# Patient Record
Sex: Male | Born: 1975
Health system: Southern US, Community
[De-identification: ages and names within clinical notes are randomized; demographics above are authoritative.]

## PROBLEM LIST (undated history)

## (undated) DIAGNOSIS — I10 Essential (primary) hypertension: Secondary | ICD-10-CM

## (undated) DIAGNOSIS — R42 Dizziness and giddiness: Secondary | ICD-10-CM

## (undated) DIAGNOSIS — K219 Gastro-esophageal reflux disease without esophagitis: Secondary | ICD-10-CM

## (undated) DIAGNOSIS — K859 Acute pancreatitis without necrosis or infection, unspecified: Secondary | ICD-10-CM

## (undated) DIAGNOSIS — R55 Syncope and collapse: Secondary | ICD-10-CM

## (undated) DIAGNOSIS — E119 Type 2 diabetes mellitus without complications: Secondary | ICD-10-CM

## (undated) HISTORY — DX: Dizziness and giddiness: R42

## (undated) HISTORY — PX: HAND SURGERY: SHX662

## (undated) HISTORY — PX: FRACTURE SURGERY: SHX138

## (undated) HISTORY — DX: Syncope and collapse: R55

---

## 2006-09-14 ENCOUNTER — Emergency Department (HOSPITAL_COMMUNITY): Admission: EM | Admit: 2006-09-14 | Discharge: 2006-09-14 | Payer: Self-pay | Admitting: Emergency Medicine

## 2006-10-13 ENCOUNTER — Emergency Department (HOSPITAL_COMMUNITY): Admission: EM | Admit: 2006-10-13 | Discharge: 2006-10-13 | Payer: Self-pay | Admitting: Emergency Medicine

## 2006-10-14 ENCOUNTER — Inpatient Hospital Stay (HOSPITAL_COMMUNITY): Admission: EM | Admit: 2006-10-14 | Discharge: 2006-10-17 | Payer: Self-pay | Admitting: Emergency Medicine

## 2010-05-21 ENCOUNTER — Emergency Department (HOSPITAL_COMMUNITY): Admission: EM | Admit: 2010-05-21 | Discharge: 2009-10-26 | Payer: Self-pay | Admitting: Emergency Medicine

## 2010-08-31 LAB — DIFFERENTIAL
Basophils Absolute: 0 10*3/uL (ref 0.0–0.1)
Basophils Relative: 0 % (ref 0–1)
Eosinophils Absolute: 0.1 10*3/uL (ref 0.0–0.7)
Eosinophils Relative: 0 % (ref 0–5)
Lymphocytes Relative: 11 % — ABNORMAL LOW (ref 12–46)
Lymphs Abs: 1.8 10*3/uL (ref 0.7–4.0)
Monocytes Absolute: 0.8 10*3/uL (ref 0.1–1.0)
Monocytes Relative: 5 % (ref 3–12)
Neutro Abs: 12.8 10*3/uL — ABNORMAL HIGH (ref 1.7–7.7)
Neutrophils Relative %: 83 % — ABNORMAL HIGH (ref 43–77)

## 2010-08-31 LAB — CBC
HCT: 43.5 % (ref 39.0–52.0)
Hemoglobin: 14.9 g/dL (ref 13.0–17.0)
MCHC: 34.3 g/dL (ref 30.0–36.0)
MCV: 83.3 fL (ref 78.0–100.0)
Platelets: 173 10*3/uL (ref 150–400)
RBC: 5.22 MIL/uL (ref 4.22–5.81)
RDW: 13.6 % (ref 11.5–15.5)
WBC: 15.5 10*3/uL — ABNORMAL HIGH (ref 4.0–10.5)

## 2010-08-31 LAB — AMYLASE: Amylase: 132 U/L — ABNORMAL HIGH (ref 0–105)

## 2010-08-31 LAB — LIPASE, BLOOD: Lipase: 25 U/L (ref 11–59)

## 2010-10-30 NOTE — Discharge Summary (Signed)
Fred Boyd, Fred Boyd                 ACCOUNT NO.:  [id number]   MEDICAL RECORD NO.:  [id number]          PATIENT TYPE:  INP   LOCATION:  A325                          FACILITY:  [facility]   PHYSICIAN:  Marcello Moores, MD   DATE OF BIRTH:  02-01-1976   DATE OF ADMISSION:  10/14/2006  DATE OF DISCHARGE:  05/05/2008LH                               DISCHARGE SUMMARY   DISCHARGE DIAGNOSES:  1. Severe pharyngitis/tonsillitis, improving.  2. Slight elevation of transaminitis, resolving.   ADMISSION HOSPITAL COURSE:  Mr. Ballin is a 35 year old man with no  significant past medical history who presented with fever, sore throat  and difficulty swallowing.  On the examination, he showed enlarged and  inflamed tonsils with pharyngitis.  CAT scan of the neck was done at  that time which showed soft tissue fullness in the nasopharynx and  palatine tonsil region area.  Otherwise, there was no visible abscess.  ENT, Dr. Gerilyn Pilgrim, also consulted, and gave him her office number to have  follow-up with her after discharge.  The patient was put on clindamycin  and Rocephin IV and he improved progressively.   PHYSICAL EXAMINATION:  GENERAL:  The patient is stable and anxious to go  home.  VITAL SIGNS: Temperature 97.5, pulse is 59, respiratory rate is 18,  blood pressure 133/56 and saturation is 99%.  HEENT:  Pink conjunctivae.  The tonsil is swollen and hyperemic and  inflamed, but less swollen than on his admission and is able to swallow  solid food without any problem currently.  CHEST:  Good air entry and clear.  CVS:  S1-S2 regular, no murmur.  ABDOMEN:  Moderately no area of tenderness.  EXTREMITY:  No pedal edema.  CNS:  He is alert and oriented.   LABORATORY DATA:  CBC:  WBC 14, hemoglobin 13, hematocrit 39, platelet  230.  Chemistry:  Sodium is 141, potassium is 4.3, chloride 104 and  bicarb 30, glucose is 106, BUN 4, creatinine 1.  ALT is 63, AST is 43,  alkaline phosphatase is 74, protein is  5.8 and albumin is 2.5.   DISCHARGE/PLAN:  The patient is afebrile and able to swallow food  without any problem and is anxious to go home, asking repeatedly, and  was discharged with Augmentin 850 mg b.i.d. for another 7 days and to  have follow-up with ENT and repeat CBC and C-met in one week while he is  following with ENT.      Marcello Moores, MD  Electronically Signed    MT/MEDQ  D:  10/19/2006  T:  10/19/2006  Job:  [id number]

## 2010-10-30 NOTE — H&P (Signed)
NAME:  Fred Boyd, Fred Boyd NO.:  [id number]   MEDICAL RECORD NO.:  [id number]          PATIENT TYPE:  INP   LOCATION:  A325                          FACILITY:  [facility]   PHYSICIAN:  Catalina Pizza, M.D.        DATE OF BIRTH:  12-09-1975   DATE OF ADMISSION:  10/14/2006  DATE OF DISCHARGE:  LH                              HISTORY & PHYSICAL   The patient is unassigned and has no routine family doctor.   CHIEF COMPLAINT:  Severe sore throat, unable to swallow anything.   HISTORY OF PRESENT ILLNESS:  Fred Boyd is a 35 year old gentleman with  minimal past medical history, who apparently a month ago was seen for  bronchitis and given erythromycin at that time for a 10-day course.  Did  have improvement following this but the patient did note approximately 2  weeks ago that drinking beer, noticed some pain in his throat.  He  continued to have worsening pain in his throat over the next week and a  half and came into the emergency department yesterday for evaluation of  this.  Of note, at that time showed a white count of 16.6 with  significant left shift with an ANC of 14.8 and neutrophils of 89%.  He  also had elevation in his liver enzymes as well, AST and ALT.  He was  given IV antibiotics at that time and a CT scan was obtained of his  neck, which showed soft tissue fullness in the nasopharynx and palatine  tonsil region but did not show any significant occlusion.  It did show  prominent nodes throughout the neck, also likely due to the pharyngitis  and laryngitis.  Apparently the patient was treated with IV antibiotics  initially and then given a Z-Pak to take at home.  He was doing saline  gargle well as Chloraseptic spray and continued to worsen.   PAST MEDICAL HISTORY:  None.   PAST SURGICAL HISTORY:  None.   HOME MEDICATIONS:  None.   ALLERGIES:  No known drug allergies.   SOCIAL HISTORY:  Denies any drug use.  Has been current smoker within  the 12 months and  occasional drinker.  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   FAMILY HISTORY:  Noncontributory.   REVIEW OF SYSTEMS:  The patient states that he has fever and has not  noticed any weight loss.  Has not had any significant nausea and  vomiting or abdominal pain but, as mentioned above, significant  swallowing and throat pain.  Has had swelling in lymph nodes in his neck  as well.  Denies any problems with constipation, diarrhea, any problems  with urination.  No difficulty with chest pains or shortness of breath.  No other neurologic symptoms.  Skin-wise, the patient does note some  rash on his face as well as under his arms bilaterally.   PHYSICAL EXAMINATION:  VITAL SIGNS:  Vitals on admission showed  temperature of 101.4, trended up to 103.1, heart rate was initially 111,  decreased down to 81, blood pressure is 143/58 at this time, 97% on room  air.  GENERAL:  This is a well-nourished, well-developed African American  gentleman lying in bed in no acute distress but still sick-appearing.  HEENT:  Pupils equal, round and react to light and accommodation.  TMs  are normal bilaterally.  Does have some mild erythematous nodular  plaques on his cheeks bilaterally.  Oropharynx shows significant bad  dentition but very enlarged, erythematous tonsils with a significant  white erosive exudate throughout posterior oropharynx.  Did have whitish  patches also on back of his tongue.  Some question of mild boggy sclerae  or mild jaundice.  NECK:  Neck is supple but does have very painful lymph nodes on  palpation in both anterior and posterior chains with palpable lymph  nodes approximately 1 cm in diameter.  LUNGS:  Clear to auscultation bilaterally.  HEART:  Regular rate and rhythm.  No murmurs, gallops or rubs.  ABDOMEN:  Soft, nontender, positive bowel sounds.  No hepatosplenomegaly  appreciated.  No tenderness or guarding.   EXTREMITIES:  No lower extremity edema.  2+ pulses in all extremities.  SKIN:  The patient does have erythematous nodular areas under both arms,  which were painful to touch.  NEUROLOGIC:  The patient is alert and oriented x3.  No deficits  appreciated.  Moving all extremities.   Lab work obtained is still all pending.  Has had rapid strep done on two  consecutive days, which have been negative.  Please note lab work from  yesterday.   IMPRESSION:  This is a 35 year old gentleman with significant  tonsillitis and pharyngitis with erosive ulcerations with febrile  illness as well mild elevation his liver enzymes.   ASSESSMENT/PLAN:  1. Tonsillitis, pharyngitis.  Will give the patient IV clindamycin and      ceftriaxone to cover broad spectrum causes for this.  Will also do      a gamut of tests looking for causes including monospot as well as a      throat culture for GC and chlamydia.  Also do two blood cultures as      well given his significant fever.  Will treat with Tylenol for his      fever.  2. Nodular skin reaction under his arms.  It is almost like a      hidradenitis suppurativa given the firm nodularity, but question      now if this is related to all his other issues going on, unclear.  3. Transaminitis.  Did have slight bump in his AST and ALT, and will      check an acute hepatitis panel at this time.   DISPOSITION:  The patient will be admitted to the floor with symptomatic  treatment with Duke's mixture and viscous lidocaine as well as morphine  and Tylenol.  Given the severity of how his posterior oropharynx looks,  I will consult ENT to see if they have anything else to offer up a  possibility that could be causing this severe pharyngitis.  I did ask  the patient and he did have a negative HIV test done approximately 2  months ago.  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      Catalina Pizza, M.D.   Electronically Signed     ZH/MEDQ  D:  10/14/2006  T:  10/14/2006  Job:  [id number]

## 2012-03-08 ENCOUNTER — Emergency Department (HOSPITAL_COMMUNITY)
Admission: EM | Admit: 2012-03-08 | Discharge: 2012-03-08 | Disposition: A | Discharge status: Home / Self Care | Payer: Self-pay | Attending: Emergency Medicine | Admitting: Emergency Medicine

## 2012-03-08 ENCOUNTER — Encounter (HOSPITAL_COMMUNITY): Payer: Self-pay | Admitting: *Deleted

## 2012-03-08 DIAGNOSIS — K089 Disorder of teeth and supporting structures, unspecified: Secondary | ICD-10-CM | POA: Insufficient documentation

## 2012-03-08 DIAGNOSIS — K0889 Other specified disorders of teeth and supporting structures: Secondary | ICD-10-CM

## 2012-03-08 DIAGNOSIS — Z880 Allergy status to penicillin: Secondary | ICD-10-CM | POA: Insufficient documentation

## 2012-03-08 MED ORDER — OXYCODONE-ACETAMINOPHEN 5-325 MG PO TABS: ORAL_TABLET | ORAL | Status: DC

## 2012-03-08 MED ORDER — CLINDAMYCIN HCL 150 MG PO CAPS: 300.0000 mg | ORAL_CAPSULE | Freq: Three times a day (TID) | ORAL | Status: DC

## 2012-03-08 NOTE — ED Notes (Signed)
C/o right lower toothache x 2 months, off and on.

## 2012-03-08 NOTE — ED Provider Notes (Signed)
History     CSN: [id number]  Arrival date & time 03/08/12  1256   First MD Initiated Contact with Patient 03/08/12 1425      Chief Complaint  Patient presents with  . Dental Pain    (Consider location/radiation/quality/duration/timing/severity/associated sxs/prior treatment) Patient is a 36 y.o. male presenting with tooth pain. The history is provided by the patient. No language interpreter was used.  Dental PainThe primary symptoms include mouth pain. Primary symptoms do not include dental injury or fever. Episode onset: several weeks ago. The symptoms are worsening. The symptoms occur constantly.  Additional symptoms include: jaw pain. Additional symptoms do not include: purulent gums, trismus and facial swelling.    History reviewed. No pertinent past medical history.  History reviewed. No pertinent past surgical history.  No family history on file.  History  Substance Use Topics  . Smoking status: Not on file  . Smokeless tobacco: Not on file  . Alcohol Use: Not on file      Review of Systems  Constitutional: Negative for fever and chills.  HENT: Positive for dental problem. Negative for facial swelling.   All other systems reviewed and are negative.    Allergies  Penicillins  Home Medications   Current Outpatient Rx  Name Route Sig Dispense Refill  . CLINDAMYCIN HCL 150 MG PO CAPS Oral Take 2 capsules (300 mg total) by mouth 3 (three) times daily. 60 capsule 0  . OXYCODONE-ACETAMINOPHEN 5-325 MG PO TABS  One tab po q 4-6 hrs prn pain 15 tablet 0    BP 145/89  Pulse 84  Temp 98.1 F (36.7 C)  Ht 6\' 1"  (1.854 m)  Wt 187 lb (84.823 kg)  BMI 24.67 kg/m2  SpO2 100%  Physical Exam  Nursing note and vitals reviewed. Constitutional: He is oriented to person, place, and time. He appears well-developed and well-nourished.  HENT:  Head: Normocephalic and atraumatic.  Mouth/Throat: Uvula is midline, oropharynx is clear and moist and mucous membranes are  normal. Normal dentition. Dental caries present. No dental abscesses or uvula swelling.    Eyes: EOM are normal.  Neck: Normal range of motion.  Cardiovascular: Normal rate, regular rhythm, normal heart sounds and intact distal pulses.   Pulmonary/Chest: Effort normal and breath sounds normal. No respiratory distress.  Abdominal: Soft. He exhibits no distension. There is no tenderness.  Musculoskeletal: Normal range of motion.  Neurological: He is alert and oriented to person, place, and time.  Skin: Skin is warm and dry.  Psychiatric: He has a normal mood and affect. Judgment normal.    ED Course  Procedures (including critical care time)  Labs Reviewed - No data to display No results found.   1. Pain, dental       MDM  no obvious abscess  rx-clindamycin rx-percocet Ibuprofen F/u with dentist ASAP         Evalina Field, PA 03/08/12 1440

## 2012-03-08 NOTE — ED Provider Notes (Signed)
Medical screening examination/treatment/procedure(s) were performed by non-physician practitioner and as supervising physician I was immediately available for consultation/collaboration.   Evaluna Utke III, MD 03/08/12 2029 

## 2012-05-15 ENCOUNTER — Emergency Department (HOSPITAL_COMMUNITY)
Admission: EM | Admit: 2012-05-15 | Discharge: 2012-05-15 | Disposition: A | Discharge status: Home / Self Care | Payer: 59 | Attending: Emergency Medicine | Admitting: Emergency Medicine

## 2012-05-15 ENCOUNTER — Encounter (HOSPITAL_COMMUNITY): Payer: Self-pay | Admitting: Emergency Medicine

## 2012-05-15 DIAGNOSIS — R1110 Vomiting, unspecified: Secondary | ICD-10-CM | POA: Insufficient documentation

## 2012-05-15 DIAGNOSIS — Z9889 Other specified postprocedural states: Secondary | ICD-10-CM | POA: Insufficient documentation

## 2012-05-15 DIAGNOSIS — Z79899 Other long term (current) drug therapy: Secondary | ICD-10-CM | POA: Insufficient documentation

## 2012-05-15 DIAGNOSIS — F17200 Nicotine dependence, unspecified, uncomplicated: Secondary | ICD-10-CM | POA: Insufficient documentation

## 2012-05-15 DIAGNOSIS — K209 Esophagitis, unspecified without bleeding: Secondary | ICD-10-CM | POA: Insufficient documentation

## 2012-05-15 DIAGNOSIS — R1310 Dysphagia, unspecified: Secondary | ICD-10-CM | POA: Insufficient documentation

## 2012-05-15 DIAGNOSIS — R12 Heartburn: Secondary | ICD-10-CM | POA: Insufficient documentation

## 2012-05-15 MED ORDER — GI COCKTAIL ~~LOC~~
30.0000 mL | Freq: Once | ORAL | Status: AC
  Administered 2012-05-15: 30 mL via ORAL
  Filled 2012-05-15: qty 30

## 2012-05-15 NOTE — ED Provider Notes (Signed)
History     CSN: [id number]  Arrival date & time 05/15/12  4098   First MD Initiated Contact with Patient 05/15/12 0813      Chief Complaint  Patient presents with  . Heartburn    (Consider location/radiation/quality/duration/timing/severity/associated sxs/prior treatment) HPI Comments: Patient c/o localized pain to the center of his chest that began after eating fried chicken yesterday.  States that it "felt like a knot in my throat that I could not swallow".  He states he tried to drink water and took Tums without relief.  Also drank soda last night but states that he vomited it back up.  Has not tried to eat or drink anything this morning.  States that he was prescribed Prilosec in the past, but he stopped taking it.  He denies hx of similar sx's, persistent vomiting, abdominal pain, shortness or breath.  States he can drink water and swallow his saliva   Patient is a 36 y.o. male presenting with heartburn. The history is provided by the patient.  Heartburn This is a new problem. The current episode started yesterday. The problem occurs constantly. The problem has been unchanged. Associated symptoms include chest pain and vomiting. Pertinent negatives include no abdominal pain, change in bowel habit, chills, congestion, coughing, diaphoresis, fever, headaches, joint swelling, nausea, neck pain, numbness, rash, sore throat or swollen glands. The symptoms are aggravated by eating. He has tried drinking Sport and exercise psychologist) for the symptoms. The treatment provided no relief.    History reviewed. No pertinent past medical history.  Past Surgical History  Procedure Date  . Hand surgery     No family history on file.  History  Substance Use Topics  . Smoking status: Light Tobacco Smoker  . Smokeless tobacco: Not on file  . Alcohol Use: Yes      Review of Systems  Constitutional: Negative for fever, chills, diaphoresis and appetite change.  HENT: Positive for trouble swallowing. Negative for  congestion, sore throat, neck pain and neck stiffness.   Respiratory: Negative for cough, choking, chest tightness and wheezing.   Cardiovascular: Positive for chest pain. Negative for palpitations.  Gastrointestinal: Positive for heartburn and vomiting. Negative for nausea, abdominal pain, diarrhea and change in bowel habit.  Musculoskeletal: Negative for joint swelling.  Skin: Negative for rash.  Neurological: Negative for numbness and headaches.  All other systems reviewed and are negative.    Allergies  Penicillins  Home Medications   Current Outpatient Rx  Name  Route  Sig  Dispense  Refill  . CALCIUM CARBONATE ANTACID 500 MG PO CHEW   Oral   Chew 2 tablets by mouth 3 (three) times daily as needed. Heartburn.         Marland Kitchen CLEAR EYES COMPLETE OP   Ophthalmic   Apply 2 drops to eye daily.           BP 149/76  Pulse 71  Temp 98.1 F (36.7 C)  Resp 20  Ht 6\' 1"  (1.854 m)  Wt 187 lb (84.823 kg)  BMI 24.67 kg/m2  SpO2 100%  Physical Exam  Nursing note and vitals reviewed. Constitutional: He is oriented to person, place, and time. He appears well-developed and well-nourished. No distress.  HENT:  Head: Normocephalic and atraumatic.  Mouth/Throat: Uvula is midline, oropharynx is clear and moist and mucous membranes are normal. No uvula swelling.  Neck: Normal range of motion. Neck supple. No tracheal deviation present.  Cardiovascular: Normal rate, normal heart sounds and intact distal pulses.   No murmur  heard. Pulmonary/Chest: Effort normal and breath sounds normal. No stridor. No respiratory distress. He exhibits no tenderness.  Abdominal: Soft. He exhibits no distension and no mass. There is no tenderness. There is no rebound and no guarding.  Musculoskeletal: Normal range of motion.  Lymphadenopathy:    He has no cervical adenopathy.  Neurological: He is alert and oriented to person, place, and time. He exhibits normal muscle tone. Coordination normal.  Skin:  Skin is warm and dry.    ED Course  Procedures (including critical care time)  Labs Reviewed - No data to display No results found.      MDM    9:30 AM patient is feeling better, sx's have resolved.  Drank fluids and ate crackers here in ED w/o difficulty or vomiting.  Sx's were likely related to esophageal irritation secondary to food   Pt agrees to soft foods today and f/u with his PMD or return here if needed     Srihan Brutus L. [place], [place] 05/15/12 [phone]

## 2012-05-15 NOTE — ED Notes (Signed)
Tolerated crackers w/o n/v.

## 2012-05-15 NOTE — ED Notes (Signed)
Pt c/o heartburn since yesterday evening. No relief from TUMS.

## 2012-05-15 NOTE — ED Notes (Signed)
Instructions and f/u information provided/reviewed; verbalizes understanding.  

## 2012-05-15 NOTE — ED Notes (Signed)
Pt tolerating po water; states, "I feel a whole lot better".  Given saltines crackers for po challenge.

## 2012-05-16 NOTE — ED Provider Notes (Signed)
Medical screening examination/treatment/procedure(s) were performed by non-physician practitioner and as supervising physician I was immediately available for consultation/collaboration.  Shelda Jakes, MD 05/16/12 [phone]

## 2012-07-06 ENCOUNTER — Encounter (HOSPITAL_COMMUNITY): Payer: Self-pay | Admitting: Emergency Medicine

## 2012-07-06 ENCOUNTER — Emergency Department (HOSPITAL_COMMUNITY)
Admission: EM | Admit: 2012-07-06 | Discharge: 2012-07-06 | Disposition: A | Discharge status: Home / Self Care | Payer: 59 | Attending: Emergency Medicine | Admitting: Emergency Medicine

## 2012-07-06 DIAGNOSIS — Z87891 Personal history of nicotine dependence: Secondary | ICD-10-CM | POA: Insufficient documentation

## 2012-07-06 DIAGNOSIS — K2990 Gastroduodenitis, unspecified, without bleeding: Secondary | ICD-10-CM | POA: Insufficient documentation

## 2012-07-06 DIAGNOSIS — K2970 Gastritis, unspecified, without bleeding: Secondary | ICD-10-CM | POA: Insufficient documentation

## 2012-07-06 DIAGNOSIS — K920 Hematemesis: Secondary | ICD-10-CM | POA: Insufficient documentation

## 2012-07-06 LAB — LIPASE, BLOOD: Lipase: 29 U/L (ref 11–59)

## 2012-07-06 LAB — COMPREHENSIVE METABOLIC PANEL
ALT: 87 U/L — ABNORMAL HIGH (ref 0–53)
AST: 40 U/L — ABNORMAL HIGH (ref 0–37)
Albumin: 4.5 g/dL (ref 3.5–5.2)
Alkaline Phosphatase: 68 U/L (ref 39–117)
BUN: 9 mg/dL (ref 6–23)
CO2: 29 mEq/L (ref 19–32)
Calcium: 10 mg/dL (ref 8.4–10.5)
Chloride: 102 mEq/L (ref 96–112)
Creatinine, Ser: 1.13 mg/dL (ref 0.50–1.35)
GFR calc Af Amer: >90 mL/min (ref >90)
GFR calc non Af Amer: 82 mL/min — ABNORMAL LOW (ref >90)
Glucose, Bld: 97 mg/dL (ref 70–99)
Potassium: 4.2 mEq/L (ref 3.5–5.1)
Sodium: 140 mEq/L (ref 135–145)
Total Bilirubin: 0.9 mg/dL (ref 0.3–1.2)
Total Protein: 8.6 g/dL — ABNORMAL HIGH (ref 6.0–8.3)

## 2012-07-06 LAB — CBC WITH DIFFERENTIAL/PLATELET
Basophils Absolute: 0 10*3/uL (ref 0.0–0.1)
Basophils Relative: 0 % (ref 0–1)
Eosinophils Absolute: 0 10*3/uL (ref 0.0–0.7)
Eosinophils Relative: 0 % (ref 0–5)
HCT: 46 % (ref 39.0–52.0)
Hemoglobin: 15.2 g/dL (ref 13.0–17.0)
Lymphocytes Relative: 17 % (ref 12–46)
Lymphs Abs: 1.6 10*3/uL (ref 0.7–4.0)
MCH: 27.4 pg (ref 26.0–34.0)
MCHC: 33 g/dL (ref 30.0–36.0)
MCV: 82.9 fL (ref 78.0–100.0)
Monocytes Absolute: 0.8 10*3/uL (ref 0.1–1.0)
Monocytes Relative: 8 % (ref 3–12)
Neutro Abs: 7.1 10*3/uL (ref 1.7–7.7)
Neutrophils Relative %: 75 % (ref 43–77)
Platelets: 188 10*3/uL (ref 150–400)
RBC: 5.55 MIL/uL (ref 4.22–5.81)
RDW: 12.7 % (ref 11.5–15.5)
WBC: 9.5 10*3/uL (ref 4.0–10.5)

## 2012-07-06 MED ORDER — PANTOPRAZOLE SODIUM 40 MG IV SOLR
40.0000 mg | Freq: Once | INTRAVENOUS | Status: AC
  Administered 2012-07-06: 40 mg via INTRAVENOUS
  Filled 2012-07-06: qty 40

## 2012-07-06 MED ORDER — HYDROCODONE-ACETAMINOPHEN 5-325 MG PO TABS: 1.0000 | ORAL_TABLET | Freq: Four times a day (QID) | ORAL | Status: DC | PRN

## 2012-07-06 MED ORDER — ONDANSETRON 4 MG PO TBDP: ORAL_TABLET | ORAL | Status: DC

## 2012-07-06 MED ORDER — KETOROLAC TROMETHAMINE 30 MG/ML IJ SOLN
30.0000 mg | Freq: Once | INTRAMUSCULAR | Status: AC
Start: 2012-07-06 — End: 2012-07-06
  Administered 2012-07-06: 30 mg via INTRAVENOUS
  Filled 2012-07-06: qty 1

## 2012-07-06 MED ORDER — PANTOPRAZOLE SODIUM 20 MG PO TBEC: DELAYED_RELEASE_TABLET | ORAL | Status: DC

## 2012-07-06 MED ORDER — ONDANSETRON HCL 4 MG/2ML IJ SOLN
4.0000 mg | Freq: Once | INTRAMUSCULAR | Status: AC
  Administered 2012-07-06: 4 mg via INTRAVENOUS
  Filled 2012-07-06: qty 2

## 2012-07-06 MED ORDER — SODIUM CHLORIDE 0.9 % IV BOLUS (SEPSIS)
1000.0000 mL | Freq: Once | INTRAVENOUS | Status: AC
  Administered 2012-07-06: 1000 mL via INTRAVENOUS

## 2012-07-06 NOTE — ED Notes (Signed)
Pt c/o sudden onset n/v x 5 hours. C/o lower abd pain. Denies constipation/d. nad noted.

## 2012-07-06 NOTE — ED Provider Notes (Signed)
History     CSN: [id number]  Arrival date & time 07/06/12  1301   First MD Initiated Contact with Patient 07/06/12 1404      Chief Complaint  Patient presents with  . Emesis    (Consider location/radiation/quality/duration/timing/severity/associated sxs/prior treatment) Patient is a 37 y.o. male presenting with vomiting. The history is provided by the patient (pt complains of much vomiting today.  pt has vomited up some blood). No language interpreter was used.  Emesis  This is a new problem. The current episode started yesterday. The problem occurs more than 10 times per day. The problem has not changed since onset.The emesis has an appearance of stomach contents and bright red blood. There has been no fever. Pertinent negatives include no abdominal pain, no chills, no cough, no diarrhea and no headaches. Risk factors: drinks alcohol.    History reviewed. No pertinent past medical history.  Past Surgical History  Procedure Date  . Hand surgery     History reviewed. No pertinent family history.  History  Substance Use Topics  . Smoking status: Former Games developer  . Smokeless tobacco: Not on file  . Alcohol Use: Yes     Comment: occasional      Review of Systems  Constitutional: Negative for chills and fatigue.  HENT: Negative for congestion, sinus pressure and ear discharge.   Eyes: Negative for discharge.  Respiratory: Negative for cough.   Cardiovascular: Negative for chest pain.  Gastrointestinal: Positive for vomiting. Negative for abdominal pain and diarrhea.  Genitourinary: Negative for frequency and hematuria.  Musculoskeletal: Negative for back pain.  Skin: Negative for rash.  Neurological: Negative for seizures and headaches.  Hematological: Negative.   Psychiatric/Behavioral: Negative for hallucinations.    Allergies  Penicillins  Home Medications   Current Outpatient Rx  Name  Route  Sig  Dispense  Refill  . CLINDAMYCIN HCL 150 MG PO CAPS   Oral  Take 300 mg by mouth 3 (three) times daily.         Marland Kitchen CLEAR EYES COMPLETE OP   Ophthalmic   Apply 2 drops to eye daily.         . ADULT MULTIVITAMIN W/MINERALS CH   Oral   Take 1 tablet by mouth daily.         Marland Kitchen ONDANSETRON 4 MG PO TBDP      4mg  ODT q4 hours prn nausea/vomit   12 tablet   0   . PANTOPRAZOLE SODIUM 20 MG PO TBEC      One tablet a day   30 tablet   0     BP 138/90  Pulse 65  Temp 98.1 F (36.7 C)  Resp 18  Ht 6\' 1"  (1.854 m)  Wt 190 lb (86.183 kg)  BMI 25.07 kg/m2  SpO2 100%  Physical Exam  Constitutional: He is oriented to person, place, and time. He appears well-developed.  HENT:  Head: Normocephalic and atraumatic.  Eyes: Conjunctivae normal and EOM are normal. No scleral icterus.  Neck: Neck supple. No thyromegaly present.  Cardiovascular: Normal rate and regular rhythm.  Exam reveals no gallop and no friction rub.   No murmur heard. Pulmonary/Chest: No stridor. He has no wheezes. He has no rales. He exhibits no tenderness.  Abdominal: He exhibits no distension. There is tenderness. There is no rebound.  Musculoskeletal: Normal range of motion. He exhibits no edema.  Lymphadenopathy:    He has no cervical adenopathy.  Neurological: He is oriented to person, place, and time.  Coordination normal.  Skin: No rash noted. No erythema.  Psychiatric: He has a normal mood and affect. His behavior is normal.    ED Course  Procedures (including critical care time)  Labs Reviewed  COMPREHENSIVE METABOLIC PANEL - Abnormal; Notable for the following:    Total Protein 8.6 (*)     AST 40 (*)     ALT 87 (*)     GFR calc non Af Amer 82 (*)     All other components within normal limits  CBC WITH DIFFERENTIAL  LIPASE, BLOOD   No results found.   1. Gastritis       MDM          Benny Lennert, MD 07/06/12 1620

## 2012-07-31 ENCOUNTER — Ambulatory Visit: Payer: 59 | Admitting: Gastroenterology

## 2012-07-31 ENCOUNTER — Telehealth: Payer: Self-pay | Admitting: Gastroenterology

## 2012-07-31 NOTE — Telephone Encounter (Signed)
Pt was a no show

## 2015-10-07 ENCOUNTER — Ambulatory Visit: Payer: Self-pay | Admitting: Allergy and Immunology

## 2016-05-02 ENCOUNTER — Emergency Department (HOSPITAL_COMMUNITY)
Admission: EM | Admit: 2016-05-02 | Discharge: 2016-05-02 | Disposition: A | Discharge status: Home / Self Care | Payer: No Typology Code available for payment source | Attending: Emergency Medicine | Admitting: Emergency Medicine

## 2016-05-02 ENCOUNTER — Encounter (HOSPITAL_COMMUNITY): Payer: Self-pay | Admitting: Emergency Medicine

## 2016-05-02 ENCOUNTER — Emergency Department (HOSPITAL_COMMUNITY): Payer: No Typology Code available for payment source

## 2016-05-02 DIAGNOSIS — W1809XA Striking against other object with subsequent fall, initial encounter: Secondary | ICD-10-CM | POA: Insufficient documentation

## 2016-05-02 DIAGNOSIS — Y9389 Activity, other specified: Secondary | ICD-10-CM | POA: Diagnosis not present

## 2016-05-02 DIAGNOSIS — Y929 Unspecified place or not applicable: Secondary | ICD-10-CM | POA: Insufficient documentation

## 2016-05-02 DIAGNOSIS — E119 Type 2 diabetes mellitus without complications: Secondary | ICD-10-CM | POA: Diagnosis not present

## 2016-05-02 DIAGNOSIS — S0232XA Fracture of orbital floor, left side, initial encounter for closed fracture: Secondary | ICD-10-CM | POA: Insufficient documentation

## 2016-05-02 DIAGNOSIS — F17200 Nicotine dependence, unspecified, uncomplicated: Secondary | ICD-10-CM | POA: Insufficient documentation

## 2016-05-02 DIAGNOSIS — Z79899 Other long term (current) drug therapy: Secondary | ICD-10-CM | POA: Insufficient documentation

## 2016-05-02 DIAGNOSIS — Y999 Unspecified external cause status: Secondary | ICD-10-CM | POA: Insufficient documentation

## 2016-05-02 DIAGNOSIS — I10 Essential (primary) hypertension: Secondary | ICD-10-CM | POA: Diagnosis not present

## 2016-05-02 DIAGNOSIS — S0990XA Unspecified injury of head, initial encounter: Secondary | ICD-10-CM | POA: Diagnosis present

## 2016-05-02 HISTORY — DX: Type 2 diabetes mellitus without complications: E11.9

## 2016-05-02 HISTORY — DX: Essential (primary) hypertension: I10

## 2016-05-02 LAB — CBG MONITORING, ED: Glucose-Capillary: 101 mg/dL — ABNORMAL HIGH (ref 65–99)

## 2016-05-02 MED ORDER — CLINDAMYCIN HCL 150 MG PO CAPS: 150.0000 mg | ORAL_CAPSULE | Freq: Four times a day (QID) | ORAL | 0 refills | Status: DC

## 2016-05-02 MED ORDER — OXYCODONE-ACETAMINOPHEN 5-325 MG PO TABS: 1.0000 | ORAL_TABLET | ORAL | 0 refills | Status: DC | PRN

## 2016-05-02 MED ORDER — HYDROMORPHONE HCL 1 MG/ML IJ SOLN
1.0000 mg | Freq: Once | INTRAMUSCULAR | Status: AC
  Administered 2016-05-02: 1 mg via INTRAMUSCULAR
  Filled 2016-05-02: qty 1

## 2016-05-02 NOTE — ED Notes (Signed)
Pt verbalized understanding of no driving and to use caution within 4 hours of taking pain meds due to meds cause drowsiness 

## 2016-05-02 NOTE — Discharge Instructions (Signed)
You'll need to follow-up with an ear nose and throat specialist Dr. Suszanne Connerseoh.  [facility] [place] [facility] [place] [facility] [place]. Call tomorrow for a follow-up appointment. Ice pack to face. Prescription for pain medicine and antibiotic.

## 2016-05-02 NOTE — ED Notes (Signed)
Patient transported to CT 

## 2016-05-02 NOTE — ED Notes (Signed)
Pt states that at times he has woke up dizziness in the morning.  Today he fell and hit left side of face on bed post.  cbg 101 currently.  C/o severe pain to left eye, left eye red.

## 2016-05-02 NOTE — ED Triage Notes (Signed)
Pt states when he woke this am he had "extremely bad dizziness. Pt fell and hit the bed rail injuring the L side of his face and his L eye. Pt reports blurry vision in his L eye. Eye is reddened and there is L sided facial edema

## 2016-05-03 ENCOUNTER — Ambulatory Visit (INDEPENDENT_AMBULATORY_CARE_PROVIDER_SITE_OTHER): Payer: No Typology Code available for payment source | Admitting: Otolaryngology

## 2016-05-03 DIAGNOSIS — S0232XA Fracture of orbital floor, left side, initial encounter for closed fracture: Secondary | ICD-10-CM | POA: Diagnosis not present

## 2016-05-03 NOTE — ED Provider Notes (Signed)
[facility] DEPT Provider Note   CSN: [id number] Arrival date & time: 05/02/16  1331     History   Chief Complaint Chief Complaint  Patient presents with  . Dizziness  . Facial Injury    HPI Fred Boyd is a 40 y.o. male.  Status post accidental fall this morning as he got out of bed striking the left side of his face on the bedpost. Fall was preceded by dizziness. No residual neurological deficits. Now complains of severe pain in his left cheek and redness in his left eye. No neck injury or extremity pain. Severity of pain is moderate      Past Medical History:  Diagnosis Date  . Diabetes mellitus without complication (HCC)   . Hypertension     There are no active problems to display for this patient.   Past Surgical History:  Procedure Laterality Date  . FRACTURE SURGERY    . HAND SURGERY         Home Medications    Prior to Admission medications   Medication Sig Start Date End Date Taking? Authorizing Provider  escitalopram (LEXAPRO) 10 MG tablet Take 10 mg by mouth daily.   Yes Historical Provider, MD  clindamycin (CLEOCIN) 150 MG capsule Take 1 capsule (150 mg total) by mouth every 6 (six) hours. 05/02/16   Donnetta HutchingBrian Gillis Boardley, MD  oxyCODONE-acetaminophen (PERCOCET) 5-325 MG tablet Take 1-2 tablets by mouth every 4 (four) hours as needed. 05/02/16   Donnetta HutchingBrian Jonelle Bann, MD    Family History Family History  Problem Relation Age of Onset  . Diabetes Mother   . Hypertension Mother   . Diabetes Father   . Hypertension Father     Social History Social History  Substance Use Topics  . Smoking status: Current Some Day Smoker  . Smokeless tobacco: Never Used  . Alcohol use Yes     Comment: occasional     Allergies   Penicillins   Review of Systems Review of Systems  All other systems reviewed and are negative.    Physical Exam Updated Vital Signs BP 130/85 (BP Location: Left Arm)   Pulse 88   Temp 97.9 F (36.6 C) (Oral)   Resp 20   Ht 6'  (1.829 m)   Wt 185 lb (83.9 kg)   SpO2 100%   BMI 25.09 kg/m   Physical Exam  Constitutional: He is oriented to person, place, and time. He appears well-developed and well-nourished.  HENT:  Head: Normocephalic.  Tender over maxillary sinus and inferior orbit  Eyes:  Hyphema noted on lateral aspect of left eye.  Extraocular movements intact  Neck: Neck supple.  Cardiovascular: Normal rate and regular rhythm.   Pulmonary/Chest: Effort normal and breath sounds normal.  Abdominal: Soft. Bowel sounds are normal.  Musculoskeletal: Normal range of motion.  Neurological: He is alert and oriented to person, place, and time.  Skin: Skin is warm and dry.  Psychiatric: He has a normal mood and affect. His behavior is normal.  Nursing note and vitals reviewed.    ED Treatments / Results  Labs (all labs ordered are listed, but only abnormal results are displayed) Labs Reviewed  CBG MONITORING, ED - Abnormal; Notable for the following:       Result Value   Glucose-Capillary 101 (*)    All other components within normal limits    EKG  EKG Interpretation  Date/Time:  Sunday May 02 2016 13:44:45 EST Ventricular Rate:  95 PR Interval:  120 QRS Duration: 80  QT Interval:  346 QTC Calculation: 434 R Axis:   78 Text Interpretation:  Normal sinus rhythm Left ventricular hypertrophy Abnormal ekg Confirmed by Gerhard MunchLOCKWOOD, ROBERT  MD [phone]([id number]) on 05/02/2016 1:56:13 PM       Radiology Ct Head Wo Contrast  Result Date: 05/02/2016 CLINICAL DATA:  Pain following fall.  Blurred vision left eye EXAM: CT HEAD WITHOUT CONTRAST CT MAXILLOFACIAL WITHOUT CONTRAST TECHNIQUE: Multidetector CT imaging of the head and maxillofacial structures were performed using the standard protocol without intravenous contrast. Multiplanar CT image reconstructions of the maxillofacial structures were also generated. COMPARISON:  None FINDINGS: CT HEAD FINDINGS Brain: The ventricles are normal in size and  configuration. There is no intracranial mass, hemorrhage, extra-axial fluid collection, or midline shift. Gray-white compartments appear normal. No acute infarct evident. Vascular: No hyperdense vessel. There is slight calcification in each carotid siphon region. Skull: The bony calvarium appears intact. Other: Mastoid air cells are clear. CT MAXILLOFACIAL FINDINGS Osseous: There is a fracture of the orbital floor on the left with depression of fat inferiorly into the superior aspect of the left maxillary antrum. The inferior orbital oblique muscle on the left is slightly displaced inferiorly but does not appear entrapped. No other fracture is evident. No dislocation is evident. No evident blastic or lytic bone lesions. Orbits: As stated above, the left inferior oblique muscle appears slightly inferiorly displaced but non entrapped. There is no intraorbital lesion. Globes appear symmetric bilaterally. There is no enlargement of the extra-ocular muscles, in the fat in the Conal regions appear symmetric and unremarkable bilaterally. Sinuses: There is mild mucosal thickening in each medial frontal sinus region. There is mild mucosal thickening in several ethmoid air cells bilaterally. There is a small retention cyst in the inferomedial right maxillary antrum. There are no air-fluid levels. No bony destruction or expansion. Soft tissues: Salivary glands appear symmetric bilaterally. No adenopathy. Visualized pharynx and upper cervical spine regions appear normal. No soft tissue abscess. IMPRESSION: CT head: No intracranial mass, hemorrhage, or extra-axial fluid collection. Gray-white compartments appear normal. There is slight carotid siphon vascular calcification bilaterally. CT maxillofacial: Depressed fracture of the left orbital floor with fat extending inferiorly into the superior left maxillary antrum. The inferior oblique muscle on the left is mildly displaced inferiorly but not entrapped. No other fracture  evident. No dislocation. Orbits appear unremarkable except for the changes in the left orbital floor region. There are areas of mild paranasal sinus disease. No bony destruction or expansion. Electronically Signed   By: Bretta BangWilliam  Woodruff III M.D.   On: 05/02/2016 15:02   Ct Maxillofacial Wo Cm  Result Date: 05/02/2016 CLINICAL DATA:  Pain following fall.  Blurred vision left eye EXAM: CT HEAD WITHOUT CONTRAST CT MAXILLOFACIAL WITHOUT CONTRAST TECHNIQUE: Multidetector CT imaging of the head and maxillofacial structures were performed using the standard protocol without intravenous contrast. Multiplanar CT image reconstructions of the maxillofacial structures were also generated. COMPARISON:  None FINDINGS: CT HEAD FINDINGS Brain: The ventricles are normal in size and configuration. There is no intracranial mass, hemorrhage, extra-axial fluid collection, or midline shift. Gray-white compartments appear normal. No acute infarct evident. Vascular: No hyperdense vessel. There is slight calcification in each carotid siphon region. Skull: The bony calvarium appears intact. Other: Mastoid air cells are clear. CT MAXILLOFACIAL FINDINGS Osseous: There is a fracture of the orbital floor on the left with depression of fat inferiorly into the superior aspect of the left maxillary antrum. The inferior orbital oblique muscle on the left is slightly displaced  inferiorly but does not appear entrapped. No other fracture is evident. No dislocation is evident. No evident blastic or lytic bone lesions. Orbits: As stated above, the left inferior oblique muscle appears slightly inferiorly displaced but non entrapped. There is no intraorbital lesion. Globes appear symmetric bilaterally. There is no enlargement of the extra-ocular muscles, in the fat in the Conal regions appear symmetric and unremarkable bilaterally. Sinuses: There is mild mucosal thickening in each medial frontal sinus region. There is mild mucosal thickening in  several ethmoid air cells bilaterally. There is a small retention cyst in the inferomedial right maxillary antrum. There are no air-fluid levels. No bony destruction or expansion. Soft tissues: Salivary glands appear symmetric bilaterally. No adenopathy. Visualized pharynx and upper cervical spine regions appear normal. No soft tissue abscess. IMPRESSION: CT head: No intracranial mass, hemorrhage, or extra-axial fluid collection. Gray-white compartments appear normal. There is slight carotid siphon vascular calcification bilaterally. CT maxillofacial: Depressed fracture of the left orbital floor with fat extending inferiorly into the superior left maxillary antrum. The inferior oblique muscle on the left is mildly displaced inferiorly but not entrapped. No other fracture evident. No dislocation. Orbits appear unremarkable except for the changes in the left orbital floor region. There are areas of mild paranasal sinus disease. No bony destruction or expansion. Electronically Signed   By: Bretta Bang III M.D.   On: 05/02/2016 15:02    Procedures Procedures (including critical care time)  Medications Ordered in ED Medications  HYDROmorphone (DILAUDID) injection 1 mg (1 mg Intramuscular Given 05/02/16 1423)     Initial Impression / Assessment and Plan / ED Course  I have reviewed the triage vital signs and the nursing notes.  Pertinent labs & imaging results that were available during my care of the patient were reviewed by me and considered in my medical decision making (see chart for details).  Clinical Course     No neuro deficits. CT of face reviewed. Patient has a depressed inferior left orbital fracture. No clinical evidence of muscular entrapment of the left ocular muscles.  Discussed results with Dr. Suszanne Conners who will follow in the office. Discharge medication clindamycin 150 mg and Percocet. Discussed with patient and his wife  Final Clinical Impressions(s) / ED Diagnoses   Final  diagnoses:  Closed fracture of left orbital floor, initial encounter [facility])    New Prescriptions Discharge Medication List as of 05/02/2016  3:56 PM    START taking these medications   Details  clindamycin (CLEOCIN) 150 MG capsule Take 1 capsule (150 mg total) by mouth every 6 (six) hours., Starting Sun 05/02/2016, Print    oxyCODONE-acetaminophen (PERCOCET) 5-325 MG tablet Take 1-2 tablets by mouth every 4 (four) hours as needed., Starting Sun 05/02/2016, Print         Donnetta Hutching, MD 05/03/16 [phone]

## 2017-05-02 ENCOUNTER — Encounter: Payer: Self-pay | Admitting: Internal Medicine

## 2017-06-23 ENCOUNTER — Ambulatory Visit: Payer: No Typology Code available for payment source | Admitting: Nurse Practitioner

## 2017-06-23 ENCOUNTER — Telehealth: Payer: Self-pay

## 2017-06-23 ENCOUNTER — Other Ambulatory Visit: Payer: Self-pay

## 2017-06-23 ENCOUNTER — Encounter: Payer: Self-pay | Admitting: Nurse Practitioner

## 2017-06-23 DIAGNOSIS — R935 Abnormal findings on diagnostic imaging of other abdominal regions, including retroperitoneum: Secondary | ICD-10-CM

## 2017-06-23 DIAGNOSIS — G8929 Other chronic pain: Secondary | ICD-10-CM

## 2017-06-23 DIAGNOSIS — R1013 Epigastric pain: Secondary | ICD-10-CM

## 2017-06-23 DIAGNOSIS — K219 Gastro-esophageal reflux disease without esophagitis: Secondary | ICD-10-CM

## 2017-06-23 NOTE — Progress Notes (Signed)
CC'D TO PCP °

## 2017-06-23 NOTE — Assessment & Plan Note (Addendum)
Patient had a CT of the abdomen and pelvis when in the emergency room at [facility].  The CT did find mild thickening of the distal esophagus.  This is possibly related to chronic gastritis and reflux, however cannot rule out more insidious pathology such as esophageal cancer.  Because of the abnormal CT, the severity of his symptoms, and the progressive nature of his symptoms we will plan for an upper endoscopy to further evaluate.  Would likely benefit from H. pylori testing as well, given the acute onset of his symptoms.  Return for follow-up in 3 months.  Proceed with EGD on propofol/MAC with Dr. Oneida Alar in near future: the risks, benefits, and alternatives have been discussed with the patient in detail. The patient states understanding and desires to proceed.  Patient is not on any anticoagulants, anxiolytics, chronic pain medications, or antidepressants.  He does drink alcohol chronically.  Denies drug use.  We will plan for the procedure on propofol/MAC given chronic alcohol use.

## 2017-06-23 NOTE — Telephone Encounter (Signed)
Tried to call pt to inform of pre-op appt 07/21/17 at 10:00am, call went straight to VM, LMOVM. Letter mailed.

## 2017-06-23 NOTE — Patient Instructions (Addendum)
1. Continue taking your acid blocker twice a day. 2. I am giving you instructions and education related to foods to avoid with heartburn as well as an article on research related to acid blockers. 3. We will schedule your procedure for you. 4. Return for follow-up in 3 months in our office. 5. Further recommendations will be made after your upper endoscopy. 6. Call if you have any questions or concerns.     Food Choices for Gastroesophageal Reflux Disease, Adult When you have gastroesophageal reflux disease (GERD), the foods you eat and your eating habits are very important. Choosing the right foods can help ease the discomfort of GERD. Consider working with a diet and nutrition specialist (dietitian) to help you make healthy food choices. What general guidelines should I follow? Eating plan  Choose healthy foods low in fat, such as fruits, vegetables, whole grains, low-fat dairy products, and lean meat, fish, and poultry.  Eat frequent, small meals instead of three large meals each day. Eat your meals slowly, in a relaxed setting. Avoid bending over or lying down until 2-3 hours after eating.  Limit high-fat foods such as fatty meats or fried foods.  Limit your intake of oils, butter, and shortening to less than 8 teaspoons each day.  Avoid the following: ? Foods that cause symptoms. These may be different for different people. Keep a food diary to keep track of foods that cause symptoms. ? Alcohol. ? Drinking large amounts of liquid with meals. ? Eating meals during the 2-3 hours before bed.  Cook foods using methods other than frying. This may include baking, grilling, or broiling. Lifestyle   Maintain a healthy weight. Ask your health care provider what weight is healthy for you. If you need to lose weight, work with your health care provider to do so safely.  Exercise for at least 30 minutes on 5 or more days each week, or as told by your health care provider.  Avoid wearing  clothes that fit tightly around your waist and chest.  Do not use any products that contain nicotine or tobacco, such as cigarettes and e-cigarettes. If you need help quitting, ask your health care provider.  Sleep with the head of your bed raised. Use a wedge under the mattress or blocks under the bed frame to raise the head of the bed. What foods are not recommended? The items listed may not be a complete list. Talk with your dietitian about what dietary choices are best for you. Grains Pastries or quick breads with added fat. [place] toast. Vegetables Deep fried vegetables. [place] fries. Any vegetables prepared with added fat. Any vegetables that cause symptoms. For some people this may include tomatoes and tomato products, chili peppers, onions and garlic, and horseradish. Fruits Any fruits prepared with added fat. Any fruits that cause symptoms. For some people this may include citrus fruits, such as oranges, grapefruit, pineapple, and lemons. Meats and other protein foods High-fat meats, such as fatty beef or pork, hot dogs, ribs, ham, sausage, salami and bacon. Fried meat or protein, including fried fish and fried chicken. Nuts and nut butters. Dairy Whole milk and chocolate milk. Sour cream. Cream. Ice cream. Cream cheese. Milk shakes. Beverages Coffee and tea, with or without caffeine. Carbonated beverages. Sodas. Energy drinks. Fruit juice made with acidic fruits (such as orange or grapefruit). Tomato juice. Alcoholic drinks. Fats and oils Butter. Margarine. Shortening. Ghee. Sweets and desserts Chocolate and cocoa. Donuts. Seasoning and other foods Pepper. Peppermint and spearmint. Any condiments,  herbs, or seasonings that cause symptoms. For some people, this may include curry, hot sauce, or vinegar-based salad dressings. Summary  When you have gastroesophageal reflux disease (GERD), food and lifestyle choices are very important to help ease the discomfort of GERD.  Eat  frequent, small meals instead of three large meals each day. Eat your meals slowly, in a relaxed setting. Avoid bending over or lying down until 2-3 hours after eating.  Limit high-fat foods such as fatty meat or fried foods. This information is not intended to replace advice given to you by your health care provider. Make sure you discuss any questions you have with your health care provider. Document Released: 05/31/2005 Document Revised: 06/01/2016 Document Reviewed: 06/01/2016 Elsevier Interactive Patient Education  [employer]2018 [employer] Inc[employer]

## 2017-06-23 NOTE — Assessment & Plan Note (Signed)
Significant abdominal pain in the epigastric area related to his GERD symptoms.  This seems to be sudden onset.  His GERD symptoms are generally well controlled today, although he does continue to have moderate left upper quadrant and epigastric pain.  There is always a possibility of H. pylori gastritis.  Additionally, he did have an abnormal CT scan.  Right upper quadrant ultrasound, per the patient, was normal.  Further workup as per below.  Continue PPI, follow-up in 3 months.

## 2017-06-23 NOTE — Patient Instructions (Signed)
Called [facility]. No PA needed for EGD.

## 2017-06-23 NOTE — Assessment & Plan Note (Signed)
Patient previously had significant GERD symptoms that required presentation to the emergency room.  He was given omeprazole once daily.  His primary care increases to twice daily.  His symptoms are well controlled if he follows a GERD diet.  If he eats foods he should not he does have symptoms.  Additionally, if he runs out of his medicine he will have significant symptoms regardless.  This seems to be sudden onset, progressive, moderate to severe.  Recommend he continue PPI.  We will provide GERD dietary information for him.  Further workup as per below related to abnormal CT scan.  Return for follow-up in 3 months.

## 2017-06-23 NOTE — Progress Notes (Signed)
Primary Care Physician:  Richardean Chimera, MD Primary Gastroenterologist:  Dr. Darrick Penna  Chief Complaint  Patient presents with  . Abdominal Pain    epigastric pain 1 month ago  . Rectal Bleeding    w/ BM 1 month ago    HPI:   Fred Boyd is a 42 y.o. male who presents referral from primary care for abdominal pain and rectal bleeding.  Reviewed ER note from [facility] on 03/07/2017 which indicated a 2-hour history of epigastric pain with radiation into his chest and his throat.  History of pancreatitis in the past.  His wife indicated he had multiple episodes of retching and vomiting and some dark blood was noted. However, it doesn't indicate if this was in his stool or in his emesis.  On exam he had diffuse tenderness and guarding in the epigastrium.  Labs ordered included CMP which found normal creatinine at 1.11, normal AST/ALT at 22/24, normal alkaline phosphatase at 89.  CBC found no leukocytosis although high normal at 10.4, normal hemoglobin at 15.3, normal platelets at 233.  CT of the abdomen found mild thickening of the distal esophagus which may be related to reflux, no other focal abnormality.  In the ER the diagnosis was abdominal pain with secondary impression of alcohol induced gastritis.  He was prescribed omeprazole 40 mg daily and Carafate.  Today he states he's doing better. Has been taking Prilosec and Carafate which has helped. Had an U/S of his gallbladder at an outpatient center which, per the patient, was normal. Has GERD symptoms ("heartburn") if he eats trigger foods. Has been taking omeprazole twice daily and if he eats right has no heartburn. He did run out of medicine for about a week and then had rebound GERD regardless of what he eats. He did receive a refill from PCP. OTC Prilosec not very effective. No persistent N/V if he is on PPI. Stopped drinking at this time due to worsening symptoms with ETOH. Denies hematochezia. Had one episode of black tarry stools  this morning. Denies fever, chills, unintentional weight loss. Denies chest pain, dyspnea, dizziness, lightheadedness, syncope, near syncope. Denies any other upper or lower GI symptoms.  Past Medical History:  Diagnosis Date  . Diabetes mellitus without complication (HCC)    Borderline; No longer diabetic  . Hypertension     Past Surgical History:  Procedure Laterality Date  . FRACTURE SURGERY    . HAND SURGERY      Current Outpatient Medications  Medication Sig Dispense Refill  . escitalopram (LEXAPRO) 10 MG tablet Take 10 mg by mouth daily.    Marland Kitchen levocetirizine (XYZAL) 5 MG tablet Take 5 mg by mouth every evening.    Marland Kitchen omeprazole (PRILOSEC) 20 MG capsule Take 20 mg by mouth daily.     No current facility-administered medications for this visit.     Allergies as of 06/23/2017 - Review Complete 06/23/2017  Allergen Reaction Noted  . Penicillins Itching 03/08/2012    Family History  Problem Relation Age of Onset  . Diabetes Mother   . Hypertension Mother   . Diabetes Father   . Hypertension Father   . Gastric cancer Cousin 37  . Colon cancer Neg Hx   . Esophageal cancer Neg Hx     Social History   Socioeconomic History  . Marital status: Married    Spouse name: Not on file  . Number of children: Not on file  . Years of education: Not on file  .  Highest education level: Not on file  Social Needs  . Financial resource strain: Not on file  . Food insecurity - worry: Not on file  . Food insecurity - inability: Not on file  . Transportation needs - medical: Not on file  . Transportation needs - non-medical: Not on file  Occupational History  . Not on file  Tobacco Use  . Smoking status: Current Some Day Smoker    Packs/day: 0.50  . Smokeless tobacco: Never Used  Substance and Sexual Activity  . Alcohol use: Yes    Comment: 06/23/17: 2-3 beer/night; cut back in last couple weeks due to symptoms  . Drug use: No  . Sexual activity: Not on file  Other Topics  Concern  . Not on file  Social History Narrative  . Not on file    Review of Systems: Complete ROS negative except as per HPI.    Physical Exam: BP 130/78   Pulse 72   Temp (!) 97.5 F (36.4 C) (Oral)   Ht 6\' 1"  (1.854 m)   Wt 188 lb 12.8 oz (85.6 kg)   BMI 24.91 kg/m  General:   Alert and oriented. Pleasant and cooperative. Well-nourished and well-developed.  Head:  Normocephalic and atraumatic. Eyes:  Without icterus, sclera clear and conjunctiva pink.  Ears:  Normal auditory acuity. Cardiovascular:  S1, S2 present without murmurs appreciated. Extremities without clubbing or edema. Respiratory:  Clear to auscultation bilaterally. No wheezes, rales, or rhonchi. No distress.  Gastrointestinal:  +BS, soft, and non-distended. Moderate TTP epigastric area. No HSM noted. No guarding or rebound. No masses appreciated.  Rectal:  Deferred  Musculoskalatal:  Symmetrical without gross deformities. Neurologic:  Alert and oriented x4;  grossly normal neurologically. Psych:  Alert and cooperative. Normal mood and affect. Heme/Lymph/Immune: No excessive bruising noted.    06/23/2017 9:26 AM   Disclaimer: This note was dictated with voice recognition software. Similar sounding words can inadvertently be transcribed and may not be corrected upon review.

## 2017-07-18 NOTE — Patient Instructions (Signed)
Fred Boyd  07/18/2017     @PREFPERIOPPHARMACY @   Your procedure is scheduled on  07/26/2017 .  [facility] at  [facility]  [facility].M.  Call this number if you have problems the morning of surgery:  [phone]   Remember:  Do not eat food or drink liquids after midnight.  Take these medicines the morning of surgery with A SIP OF WATER  Lexapro, prilosec.   Do not wear jewelry, make-up or nail polish.  Do not wear lotions, powders, or perfumes, or deodorant.  Do not shave 48 hours prior to surgery.  Men may shave face and neck.  Do not bring valuables to the hospital.  [facility] any belongings or valuables.  Contacts, dentures or bridgework may not be worn into surgery.  Leave your suitcase in the car.  After surgery it may be brought to your room.  For patients admitted to the hospital, discharge time will be determined by your treatment team.  Patients discharged the day of surgery will not be allowed to drive home.   Name and phone number of your driver:   family Special instructions:  Follow the diet instructions given to you by Dr Evelina DunField's office.  Please read over the following fact sheets that you were given. Anesthesia Post-op Instructions and Care and Recovery After Surgery       Esophagogastroduodenoscopy Esophagogastroduodenoscopy (EGD) is a procedure to examine the lining of the esophagus, stomach, and first part of the small intestine (duodenum). This procedure is done to check for problems such as inflammation, bleeding, ulcers, or growths. During this procedure, a long, flexible, lighted tube with a camera attached (endoscope) is inserted down the throat. Tell a health care provider about:  Any allergies you have.  All medicines you are taking, including vitamins, herbs, eye drops, creams, and over-the-counter medicines.  Any problems you or family members have had with anesthetic medicines.  Any blood disorders  you have.  Any surgeries you have had.  Any medical conditions you have.  Whether you are pregnant or may be pregnant. What are the risks? Generally, this is a safe procedure. However, problems may occur, including:  Infection.  Bleeding.  A tear (perforation) in the esophagus, stomach, or duodenum.  Trouble breathing.  Excessive sweating.  Spasms of the larynx.  A slowed heartbeat.  Low blood pressure.  What happens before the procedure?  Follow instructions from your health care provider about eating or drinking restrictions.  Ask your health care provider about: ? Changing or stopping your regular medicines. This is especially important if you are taking diabetes medicines or blood thinners. ? Taking medicines such as aspirin and ibuprofen. These medicines can thin your blood. Do not take these medicines before your procedure if your health care provider instructs you not to.  Plan to have someone take you home after the procedure.  If you wear dentures, be ready to remove them before the procedure. What happens during the procedure?  To reduce your risk of infection, your health care team will wash or sanitize their hands.  An IV tube will be put in a vein in your hand or arm. You will get medicines and fluids through this tube.  You will be given one or more of the following: ? A medicine to help you relax (sedative). ? A medicine to numb the area (local anesthetic). This medicine may be sprayed into your  throat. It will make you feel more comfortable and keep you from gagging or coughing during the procedure. ? A medicine for pain.  A mouth guard may be placed in your mouth to protect your teeth and to keep you from biting on the endoscope.  You will be asked to lie on your left side.  The endoscope will be lowered down your throat into your esophagus, stomach, and duodenum.  Air will be put into the endoscope. This will help your health care provider see  better.  The lining of your esophagus, stomach, and duodenum will be examined.  Your health care provider may: ? Take a tissue sample so it can be looked at in a lab (biopsy). ? Remove growths. ? Remove objects (foreign bodies) that are stuck. ? Treat any bleeding with medicines or other devices that stop tissue from bleeding. ? Widen (dilate) or stretch narrowed areas of your esophagus and stomach.  The endoscope will be taken out. The procedure may vary among health care providers and hospitals. What happens after the procedure?  Your blood pressure, heart rate, breathing rate, and blood oxygen level will be monitored often until the medicines you were given have worn off.  Do not eat or drink anything until the numbing medicine has worn off and your gag reflex has returned. This information is not intended to replace advice given to you by your health care provider. Make sure you discuss any questions you have with your health care provider. Document Released: 10/01/2004 Document Revised: 11/06/2015 Document Reviewed: 04/24/2015 Elsevier Interactive Patient Education  2018 [employer]. Esophagogastroduodenoscopy, Care After Refer to this sheet in the next few weeks. These instructions provide you with information about caring for yourself after your procedure. Your health care provider may also give you more specific instructions. Your treatment has been planned according to current medical practices, but problems sometimes occur. Call your health care provider if you have any problems or questions after your procedure. What can I expect after the procedure? After the procedure, it is common to have:  A sore throat.  Nausea.  Bloating.  Dizziness.  Fatigue.  Follow these instructions at home:  Do not eat or drink anything until the numbing medicine (local anesthetic) has worn off and your gag reflex has returned. You will know that the local anesthetic has worn off when you  can swallow comfortably.  Do not drive for 24 hours if you received a medicine to help you relax (sedative).  If your health care provider took a tissue sample for testing during the procedure, make sure to get your test results. This is your responsibility. Ask your health care provider or the department performing the test when your results will be ready.  Keep all follow-up visits as told by your health care provider. This is important. Contact a health care provider if:  You cannot stop coughing.  You are not urinating.  You are urinating less than usual. Get help right away if:  You have trouble swallowing.  You cannot eat or drink.  You have throat or chest pain that gets worse.  You are dizzy or light-headed.  You faint.  You have nausea or vomiting.  You have chills.  You have a fever.  You have severe abdominal pain.  You have black, tarry, or bloody stools. This information is not intended to replace advice given to you by your health care provider. Make sure you discuss any questions you have with your health care provider.  Document Released: 05/17/2012 Document Revised: 11/06/2015 Document Reviewed: 04/24/2015 Elsevier Interactive Patient Education  2018 [place] Anesthesia is a term that refers to techniques, procedures, and medicines that help a person stay safe and comfortable during a medical procedure. Monitored anesthesia care, or sedation, is one type of anesthesia. Your anesthesia specialist may recommend sedation if you will be having a procedure that does not require you to be unconscious, such as:  Cataract surgery.  A dental procedure.  A biopsy.  A colonoscopy.  During the procedure, you may receive a medicine to help you relax (sedative). There are three levels of sedation:  Mild sedation. At this level, you may feel awake and relaxed. You will be able to follow directions.  Moderate sedation. At this  level, you will be sleepy. You may not remember the procedure.  Deep sedation. At this level, you will be asleep. You will not remember the procedure.  The more medicine you are given, the deeper your level of sedation will be. Depending on how you respond to the procedure, the anesthesia specialist may change your level of sedation or the type of anesthesia to fit your needs. An anesthesia specialist will monitor you closely during the procedure. Let your health care provider know about:  Any allergies you have.  All medicines you are taking, including vitamins, herbs, eye drops, creams, and over-the-counter medicines.  Any use of steroids (by mouth or as a cream).  Any problems you or family members have had with sedatives and anesthetic medicines.  Any blood disorders you have.  Any surgeries you have had.  Any medical conditions you have, such as sleep apnea.  Whether you are pregnant or may be pregnant.  Any use of cigarettes, alcohol, or street drugs. What are the risks? Generally, this is a safe procedure. However, problems may occur, including:  Getting too much medicine (oversedation).  Nausea.  Allergic reaction to medicines.  Trouble breathing. If this happens, a breathing tube may be used to help with breathing. It will be removed when you are awake and breathing on your own.  Heart trouble.  Lung trouble.  Before the procedure Staying hydrated Follow instructions from your health care provider about hydration, which may include:  Up to 2 hours before the procedure - you may continue to drink clear liquids, such as water, clear fruit juice, black coffee, and plain tea.  Eating and drinking restrictions Follow instructions from your health care provider about eating and drinking, which may include:  8 hours before the procedure - stop eating heavy meals or foods such as meat, fried foods, or fatty foods.  6 hours before the procedure - stop eating light  meals or foods, such as toast or cereal.  6 hours before the procedure - stop drinking milk or drinks that contain milk.  2 hours before the procedure - stop drinking clear liquids.  Medicines Ask your health care provider about:  Changing or stopping your regular medicines. This is especially important if you are taking diabetes medicines or blood thinners.  Taking medicines such as aspirin and ibuprofen. These medicines can thin your blood. Do not take these medicines before your procedure if your health care provider instructs you not to.  Tests and exams  You will have a physical exam.  You may have blood tests done to show: ? How well your kidneys and liver are working. ? How well your blood can clot.  General instructions  Plan to have  someone take you home from the hospital or clinic.  If you will be going home right after the procedure, plan to have someone with you for 24 hours.  What happens during the procedure?  Your blood pressure, heart rate, breathing, level of pain and overall condition will be monitored.  An IV tube will be inserted into one of your veins.  Your anesthesia specialist will give you medicines as needed to keep you comfortable during the procedure. This may mean changing the level of sedation.  The procedure will be performed. After the procedure  Your blood pressure, heart rate, breathing rate, and blood oxygen level will be monitored until the medicines you were given have worn off.  Do not drive for 24 hours if you received a sedative.  You may: ? Feel sleepy, clumsy, or nauseous. ? Feel forgetful about what happened after the procedure. ? Have a sore throat if you had a breathing tube during the procedure. ? Vomit. This information is not intended to replace advice given to you by your health care provider. Make sure you discuss any questions you have with your health care provider. Document Released: 02/24/2005 Document Revised:  11/07/2015 Document Reviewed: 09/21/2015 Elsevier Interactive Patient Education  2018 [place], Care After These instructions provide you with information about caring for yourself after your procedure. Your health care provider may also give you more specific instructions. Your treatment has been planned according to current medical practices, but problems sometimes occur. Call your health care provider if you have any problems or questions after your procedure. What can I expect after the procedure? After your procedure, it is common to:  Feel sleepy for several hours.  Feel clumsy and have poor balance for several hours.  Feel forgetful about what happened after the procedure.  Have poor judgment for several hours.  Feel nauseous or vomit.  Have a sore throat if you had a breathing tube during the procedure.  Follow these instructions at home: For at least 24 hours after the procedure:   Do not: ? Participate in activities in which you could fall or become injured. ? Drive. ? Use heavy machinery. ? Drink alcohol. ? Take sleeping pills or medicines that cause drowsiness. ? Make important decisions or sign legal documents. ? Take care of children on your own.  Rest. Eating and drinking  Follow the diet that is recommended by your health care provider.  If you vomit, drink water, juice, or soup when you can drink without vomiting.  Make sure you have little or no nausea before eating solid foods. General instructions  Have a responsible adult stay with you until you are awake and alert.  Take over-the-counter and prescription medicines only as told by your health care provider.  If you smoke, do not smoke without supervision.  Keep all follow-up visits as told by your health care provider. This is important. Contact a health care provider if:  You keep feeling nauseous or you keep vomiting.  You feel light-headed.  You develop a  rash.  You have a fever. Get help right away if:  You have trouble breathing. This information is not intended to replace advice given to you by your health care provider. Make sure you discuss any questions you have with your health care provider. Document Released: 09/21/2015 Document Revised: 01/21/2016 Document Reviewed: 09/21/2015 Elsevier Interactive Patient Education  [employer].

## 2017-07-21 ENCOUNTER — Telehealth: Payer: Self-pay | Admitting: *Deleted

## 2017-07-21 ENCOUNTER — Encounter (HOSPITAL_COMMUNITY)
Admission: RE | Admit: 2017-07-21 | Discharge: 2017-07-21 | Disposition: A | Discharge status: Home / Self Care | Payer: PRIVATE HEALTH INSURANCE | Source: Ambulatory Visit | Attending: Gastroenterology | Admitting: Gastroenterology

## 2017-07-21 ENCOUNTER — Encounter (HOSPITAL_COMMUNITY): Payer: Self-pay

## 2017-07-21 NOTE — Telephone Encounter (Signed)
Noted  

## 2017-07-21 NOTE — Telephone Encounter (Signed)
I called spoke with pt and he reports he is not having his procedure done due to cost of procedure he has to pay out of pocket. Called Eber JonesCarolyn and made aware of cancellation. FYI to [employer].

## 2017-07-26 ENCOUNTER — Ambulatory Visit (HOSPITAL_COMMUNITY)
Admission: RE | Admit: 2017-07-26 | Payer: PRIVATE HEALTH INSURANCE | Source: Ambulatory Visit | Admitting: Gastroenterology

## 2017-07-26 ENCOUNTER — Encounter (HOSPITAL_COMMUNITY): Admission: RE | Payer: Self-pay | Source: Ambulatory Visit

## 2017-07-26 SURGERY — ESOPHAGOGASTRODUODENOSCOPY (EGD) WITH PROPOFOL
Anesthesia: Monitor Anesthesia Care

## 2017-09-28 ENCOUNTER — Ambulatory Visit: Payer: PRIVATE HEALTH INSURANCE | Admitting: Nurse Practitioner

## 2018-04-27 ENCOUNTER — Encounter (HOSPITAL_COMMUNITY): Payer: Self-pay

## 2018-04-27 ENCOUNTER — Emergency Department (HOSPITAL_COMMUNITY)
Admission: EM | Admit: 2018-04-27 | Discharge: 2018-04-27 | Disposition: A | Discharge status: Home / Self Care | Payer: Commercial Managed Care - PPO | Attending: Emergency Medicine | Admitting: Emergency Medicine

## 2018-04-27 ENCOUNTER — Emergency Department (HOSPITAL_COMMUNITY): Payer: Commercial Managed Care - PPO

## 2018-04-27 ENCOUNTER — Other Ambulatory Visit: Payer: Self-pay

## 2018-04-27 DIAGNOSIS — M542 Cervicalgia: Secondary | ICD-10-CM | POA: Insufficient documentation

## 2018-04-27 DIAGNOSIS — R5383 Other fatigue: Secondary | ICD-10-CM | POA: Insufficient documentation

## 2018-04-27 DIAGNOSIS — R0602 Shortness of breath: Secondary | ICD-10-CM | POA: Insufficient documentation

## 2018-04-27 DIAGNOSIS — R05 Cough: Secondary | ICD-10-CM | POA: Diagnosis not present

## 2018-04-27 DIAGNOSIS — R200 Anesthesia of skin: Secondary | ICD-10-CM | POA: Insufficient documentation

## 2018-04-27 DIAGNOSIS — Z87891 Personal history of nicotine dependence: Secondary | ICD-10-CM | POA: Diagnosis not present

## 2018-04-27 DIAGNOSIS — Z79899 Other long term (current) drug therapy: Secondary | ICD-10-CM | POA: Insufficient documentation

## 2018-04-27 DIAGNOSIS — R079 Chest pain, unspecified: Secondary | ICD-10-CM | POA: Diagnosis not present

## 2018-04-27 DIAGNOSIS — I10 Essential (primary) hypertension: Secondary | ICD-10-CM | POA: Diagnosis not present

## 2018-04-27 DIAGNOSIS — R072 Precordial pain: Secondary | ICD-10-CM | POA: Diagnosis not present

## 2018-04-27 LAB — I-STAT CHEM 8, ED
BUN: 15 mg/dL (ref 6–20)
Calcium, Ion: 1.24 mmol/L (ref 1.15–1.40)
Chloride: 103 mmol/L (ref 98–111)
Creatinine, Ser: 1.3 mg/dL — ABNORMAL HIGH (ref 0.61–1.24)
Glucose, Bld: 118 mg/dL — ABNORMAL HIGH (ref 70–99)
HCT: 48 % (ref 39.0–52.0)
Hemoglobin: 16.3 g/dL (ref 13.0–17.0)
Potassium: 3.9 mmol/L (ref 3.5–5.1)
Sodium: 142 mmol/L (ref 135–145)
TCO2: 32 mmol/L (ref 22–32)

## 2018-04-27 LAB — I-STAT TROPONIN, ED: Troponin i, poc: 0 ng/mL (ref 0.00–0.08)

## 2018-04-27 LAB — CBG MONITORING, ED: Glucose-Capillary: 122 mg/dL — ABNORMAL HIGH (ref 70–99)

## 2018-04-27 MED ORDER — KETOROLAC TROMETHAMINE 30 MG/ML IJ SOLN
30.0000 mg | Freq: Once | INTRAMUSCULAR | Status: AC
  Administered 2018-04-27: 30 mg via INTRAVENOUS
  Filled 2018-04-27: qty 1

## 2018-04-27 NOTE — ED Notes (Signed)
ED Provider at bedside. 

## 2018-04-27 NOTE — ED Triage Notes (Signed)
Pt arrives POV from home c/o left chest pain radiating to left shoulder. States pain began yesterday and comes and goes.

## 2018-04-27 NOTE — Discharge Instructions (Signed)

## 2018-04-27 NOTE — ED Provider Notes (Signed)
[facility] EMERGENCY DEPARTMENT Provider Note   CSN: [id number] Arrival date & time: 04/27/18  [id number]     History   Chief Complaint Chief Complaint  Patient presents with  . Chest Pain    HPI Fred Snowballeri T Boyd is a 42 y.o. male.  The history is provided by the patient.  Chest Pain   This is a new problem. The current episode started more than 2 days ago. The problem occurs daily. The problem has been gradually worsening. The pain is moderate. The quality of the pain is described as sharp. The pain radiates to the left shoulder. Associated symptoms include shortness of breath. Pertinent negatives include no abdominal pain, no diaphoresis, no hemoptysis, no lower extremity edema and no vomiting. Treatments tried: ASA. The treatment provided no relief.  His past medical history is significant for hypertension.  Pertinent negatives for past medical history include no CAD and no PE.  His family medical history is significant for CAD.   PT Presents with chest pain.  He reports for over 2 days has been having intermittent episodes of left upper chest pain.  At times it is worse with movement and palpation.  It feels sharp at times and it will move into his shoulder.  He also has isolated left shoulder pain.  He also has neck pain at times.  He also reports at times he has numbness in his left ring and little finger, but no other numbness or weakness of the upper extremity.  He reports that he works a lot, 12-hour days and usually wakes up at 2 AM to start working.  He reports feeling significant fatigue.  PT reports he has not been exerting himself for 2 days, but he still has pain.  It appears to occur at random  He reports strong family history of CAD. Past Medical History:  Diagnosis Date  . Diabetes mellitus without complication (HCC)    Borderline; No longer diabetic  . Hypertension     Patient Active Problem List   Diagnosis Date Noted  . GERD (gastroesophageal reflux disease)  06/23/2017  . Abdominal pain, chronic, epigastric 06/23/2017  . Abnormal CT of the abdomen 06/23/2017    Past Surgical History:  Procedure Laterality Date  . FRACTURE SURGERY    . HAND SURGERY          Home Medications    Prior to Admission medications   Medication Sig Start Date End Date Taking? Authorizing Provider  escitalopram (LEXAPRO) 10 MG tablet Take 10 mg by mouth daily.    [provider]  levocetirizine (XYZAL) 5 MG tablet Take 5 mg by mouth every evening.    [provider]  omeprazole (PRILOSEC) 20 MG capsule Take 20 mg by mouth daily.    [provider]    Family History Family History  Problem Relation Age of Onset  . Diabetes Mother   . Hypertension Mother   . Diabetes Father   . Hypertension Father   . Gastric cancer Cousin 37  . Colon cancer Neg Hx   . Esophageal cancer Neg Hx     Social History Social History   Tobacco Use  . Smoking status: Former Smoker    Packs/day: 0.50    Years: 20.00    Pack years: 10.00    Types: Cigarettes    Last attempt to quit: 11/12/2017    Years since quitting: 0.4  . Smokeless tobacco: Never Used  Substance Use Topics  . Alcohol use: Yes  Comment: 06/23/17: 2-3 beer/night; cut back in last couple weeks due to symptoms  . Drug use: No     Allergies   Penicillins   Review of Systems Review of Systems  Constitutional: Positive for fatigue. Negative for diaphoresis.  Respiratory: Positive for shortness of breath. Negative for hemoptysis.   Cardiovascular: Positive for chest pain. Negative for leg swelling.  Gastrointestinal: Negative for abdominal pain and vomiting.  Neurological: Negative for syncope.  All other systems reviewed and are negative.    Physical Exam Updated Vital Signs BP (!) 143/90   Pulse (!) 59   Temp 97.9 F (36.6 C) (Oral)   Resp 13   Ht 1.829 m (6')   Wt 88.5 kg   SpO2 98%   BMI 26.45 kg/m   Physical Exam  CONSTITUTIONAL: Well developed/well  nourished HEAD: Normocephalic/atraumatic EYES: EOMI/PERRL ENMT: Mucous membranes moist NECK: supple no meningeal signs SPINE/BACK:entire spine nontender CV: S1/S2 noted, no murmurs/rubs/gallops noted LUNGS: Lungs are clear to auscultation bilaterally, no apparent distress Chest - tenderness to left upper chest, no crepitus or masses noted ABDOMEN: soft, nontender, no rebound or guarding, bowel sounds noted throughout abdomen GU:no cva tenderness NEURO: Pt is awake/alert/appropriate, moves all extremitiesx4.  No facial droop.  Equal handgrips, equal power bilateral upper extremities.  No focal weakness noted.  Reports numbness to left ring and little finger. EXTREMITIES: pulses normal/equal, full ROM, no lower extremity edema or tenderness and no discoloration, all extremities are warm to touch SKIN: warm, color normal PSYCH: no abnormalities of mood noted, alert and oriented to situation  ED Treatments / Results  Labs (all labs ordered are listed, but only abnormal results are displayed) Labs Reviewed  CBG MONITORING, ED - Abnormal; Notable for the following components:      Result Value   Glucose-Capillary 122 (*)    All other components within normal limits  I-STAT CHEM 8, ED - Abnormal; Notable for the following components:   Creatinine, Ser 1.30 (*)    Glucose, Bld 118 (*)    All other components within normal limits  I-STAT TROPONIN, ED    EKG EKG Interpretation  Date/Time:  Thursday April 27 2018 05:32:04 EST Ventricular Rate:  65 PR Interval:    QRS Duration: 92 QT Interval:  389 QTC Calculation: 405 R Axis:   69 Text Interpretation:  Sinus rhythm ST elev, probable normal early repol pattern No significant change since last tracing Confirmed by Zadie Rhine ([id number]) on 04/27/2018 6:00:09 AM   Radiology Dg Chest 2 View  Result Date: 04/27/2018 CLINICAL DATA:  Acute onset of generalized chest pain, shortness of breath and productive cough. EXAM: CHEST - 2  VIEW COMPARISON:  Chest radiograph performed 01/27/2015 FINDINGS: The lungs are well-aerated and clear. There is no evidence of focal opacification, pleural effusion or pneumothorax. The heart is normal in size; the mediastinal contour is within normal limits. No acute osseous abnormalities are seen. IMPRESSION: No acute cardiopulmonary process seen. Electronically Signed   By: Roanna Raider M.D.   On: 04/27/2018 06:54    Procedures Procedures Medications Ordered in ED Medications  ketorolac (TORADOL) 30 MG/ML injection 30 mg (30 mg Intravenous Given 04/27/18 0559)     Initial Impression / Assessment and Plan / ED Course  I have reviewed the triage vital signs and the nursing notes.  Pertinent labs & imaging results that were available during my care of the patient were reviewed by me and considered in my medical decision making (see chart for details).  6:30 AM Patient presents with intermittent chest pain for 2 days. Heart score less than 3.  PERC negative EKG is unchanged from prior.  Chest pain is reproducible with palpation. Initial troponin is negative.  Chest x-ray is pending at this time 8:04 AM Work-up reassuring.  Patient is in no distress.  He is feeling improved.  He had one episode of chest pain lasted 2 seconds while waiting in the ER.  Suspicion for ACS/PE/dissection is low.  Advised utilizing NSAIDs for 3 to 5 days for pain.  Discussed need for PCP follow-up as he may need to go on medications for blood pressure. I do not think further work-up is required at this time Final Clinical Impressions(s) / ED Diagnoses   Final diagnoses:  Precordial pain    ED Discharge Orders    None       Zadie Rhine, MD 04/27/18 [id number]

## 2018-04-27 NOTE — ED Notes (Signed)
Patient on cardiac monitoring at this time. Patient has had EKG done and seen by Dr Bebe ShaggyWickline.

## 2018-06-01 ENCOUNTER — Ambulatory Visit: Payer: Self-pay | Admitting: Family

## 2018-06-01 ENCOUNTER — Encounter: Payer: Self-pay | Admitting: Family

## 2018-06-01 ENCOUNTER — Ambulatory Visit: Payer: Commercial Managed Care - PPO | Admitting: Family

## 2018-06-01 VITALS — BP 137/89 | HR 78 | Temp 97.4°F | Ht 72.0 in | Wt 199.6 lb

## 2018-06-01 DIAGNOSIS — K219 Gastro-esophageal reflux disease without esophagitis: Secondary | ICD-10-CM | POA: Diagnosis not present

## 2018-06-01 DIAGNOSIS — R1032 Left lower quadrant pain: Secondary | ICD-10-CM

## 2018-06-01 MED ORDER — OMEPRAZOLE 20 MG PO CPDR: 20.0000 mg | DELAYED_RELEASE_CAPSULE | Freq: Two times a day (BID) | ORAL | 1 refills | Status: DC

## 2018-06-01 NOTE — Patient Instructions (Signed)
Abdominal Pain, Adult  Abdominal pain can be caused by many things. Often, abdominal pain is not serious and it gets better with no treatment or by being treated at home. However, sometimes abdominal pain is serious. Your health care provider will do a medical history and a physical exam to try to determine the cause of your abdominal pain.  Follow these instructions at home:   Take over-the-counter and prescription medicines only as told by your health care provider. Do not take a laxative unless told by your health care provider.   Drink enough fluid to keep your urine clear or pale yellow.   Watch your condition for any changes.   Keep all follow-up visits as told by your health care provider. This is important.  Contact a health care provider if:   Your abdominal pain changes or gets worse.   You are not hungry or you lose weight without trying.   You are constipated or have diarrhea for more than 2-3 days.   You have pain when you urinate or have a bowel movement.   Your abdominal pain wakes you up at night.   Your pain gets worse with meals, after eating, or with certain foods.   You are throwing up and cannot keep anything down.   You have a fever.  Get help right away if:   Your pain does not go away as soon as your health care provider told you to expect.   You cannot stop throwing up.   Your pain is only in areas of the abdomen, such as the right side or the left lower portion of the abdomen.   You have bloody or black stools, or stools that look like tar.   You have severe pain, cramping, or bloating in your abdomen.   You have signs of dehydration, such as:  ? Dark urine, very little urine, or no urine.  ? Cracked lips.  ? Dry mouth.  ? Sunken eyes.  ? Sleepiness.  ? Weakness.  This information is not intended to replace advice given to you by your health care provider. Make sure you discuss any questions you have with your health care provider.  Document Released: 03/10/2005 Document  Revised: 12/19/2015 Document Reviewed: 11/12/2015  Elsevier Interactive Patient Education  2019 [employer]

## 2018-06-01 NOTE — Progress Notes (Addendum)
Subjective:    Patient ID: Fred Boyd, male    DOB: 1975-11-04, 42 y.o.   MRN: [id number]  Chief Complaint  Patient presents with  . Gastroesophageal Reflux  . left abdominal pain   PT presents to the office today to establish care.  Gastroesophageal Reflux  He complains of abdominal pain, belching, dysphagia, heartburn, nausea and a sore throat. He reports no choking. This is a chronic problem. The current episode started more than 1 year ago. The problem occurs constantly. The problem has been waxing and waning. Heartburn location: LLQ. The heartburn is of mild intensity. The heartburn wakes him from sleep. The symptoms are aggravated by certain foods. Risk factors include caffeine use. He has tried a PPI for the symptoms. The treatment provided mild relief.  Abdominal Pain  This is a recurrent problem. The current episode started in the past 7 days. The problem occurs intermittently. The problem has been gradually worsening. The pain is located in the LUQ. The quality of the pain is sharp. Associated symptoms include belching, nausea and vomiting. His past medical history is significant for GERD.      Review of Systems  HENT: Positive for sore throat.   Respiratory: Negative for choking.   Gastrointestinal: Positive for abdominal pain, dysphagia, heartburn, nausea and vomiting.  All other systems reviewed and are negative.   Family History  Problem Relation Age of Onset  . Diabetes Mother   . Hypertension Mother   . Diabetes Father   . Hypertension Father   . Gastric cancer Cousin 42  . Colon cancer Neg Hx   . Esophageal cancer Neg Hx     Social History   Socioeconomic History  . Marital status: Married    Spouse name: Not on file  . Number of children: Not on file  . Years of education: Not on file  . Highest education level: Not on file  Occupational History  . Not on file  Social Needs  . Financial resource strain: Not on file  . Food insecurity:    Worry:  Not on file    Inability: Not on file  . Transportation needs:    Medical: Not on file    Non-medical: Not on file  Tobacco Use  . Smoking status: Former Smoker    Packs/day: 0.50    Years: 20.00    Pack years: 10.00    Types: Cigarettes    Last attempt to quit: 11/12/2017    Years since quitting: 0.5  . Smokeless tobacco: Never Used  Substance and Sexual Activity  . Alcohol use: Yes    Comment: 06/23/17: 2-3 beer/night; cut back in last couple weeks due to symptoms  . Drug use: No  . Sexual activity: Yes  Lifestyle  . Physical activity:    Days per week: Not on file    Minutes per session: Not on file  . Stress: Not on file  Relationships  . Social connections:    Talks on phone: Not on file    Gets together: Not on file    Attends religious service: Not on file    Active member of club or organization: Not on file    Attends meetings of clubs or organizations: Not on file    Relationship status: Not on file  Other Topics Concern  . Not on file  Social History Narrative  . Not on file       Objective:   Physical Exam Vitals signs reviewed.  Constitutional:  General: He is not in acute distress.    Appearance: He is well-developed. He is diaphoretic.  HENT:     Head: Normocephalic.  Eyes:     General:        Right eye: No discharge.        Left eye: No discharge.     Pupils: Pupils are equal, round, and reactive to light.  Neck:     Musculoskeletal: Normal range of motion and neck supple.     Thyroid: No thyromegaly.  Cardiovascular:     Rate and Rhythm: Normal rate and regular rhythm.     Heart sounds: Normal heart sounds. No murmur.  Pulmonary:     Effort: Pulmonary effort is normal. No respiratory distress.     Breath sounds: Normal breath sounds. No wheezing.  Abdominal:     General: Bowel sounds are normal.     Palpations: Abdomen is soft.     Tenderness: There is abdominal tenderness. There is guarding.  Musculoskeletal: Normal range of motion.         General: No tenderness.  Skin:    General: Skin is warm.     Findings: No erythema or rash.  Neurological:     Mental Status: He is alert and oriented to person, place, and time.     Cranial Nerves: No cranial nerve deficit.     Deep Tendon Reflexes: Reflexes are normal and symmetric.  Psychiatric:        Behavior: Behavior normal.        Thought Content: Thought content normal.        Judgment: Judgment normal.       BP 137/89   Pulse 78   Temp (!) 97.4 F (36.3 C) (Oral)   Ht 6' (1.829 m)   Wt 199 lb 9.6 oz (90.5 kg)   BMI 27.07 kg/m      Assessment & Plan:  Fred Boyd comes in today with chief complaint of Gastroesophageal Reflux and left abdominal pain   Diagnosis and orders addressed:  1. Left lower quadrant abdominal pain Will order stat CT - CT ABDOMEN PELVIS W CONTRAST; Future - CBC with Differential/Platelet - CMP14+EGFR - H Pylori, IGM, IGG, IGA AB  2. Gastroesophageal reflux disease, esophagitis presence not specified Will increase Prilosec to 20 mg twice a day from 20 mg daily -Diet discussed- Avoid fried, spicy, citrus foods, caffeine and alcohol -Do not eat 2-3 hours before bedtime -Encouraged small frequent meals -Avoid NSAID's - CBC with Differential/Platelet - CMP14+EGFR - omeprazole (PRILOSEC) 20 MG capsule; Take 1 capsule (20 mg total) by mouth 2 (two) times daily before a meal.  Dispense: 180 capsule; Refill: 1 - H Pylori, IGM, IGG, IGA AB   Labs pending  Follow up plan: 1 months     *Addendum- It would be a 3-4 hour wait to get CT scan. Pt states he does not wish to wait this long. Appt made for 10 Am 06/02/18. He will go to ED if pain worsen, fever, N&V, or unable to have BM.  Evelina Dun, FNP

## 2018-06-02 ENCOUNTER — Encounter (HOSPITAL_COMMUNITY): Payer: Self-pay

## 2018-06-02 ENCOUNTER — Ambulatory Visit (HOSPITAL_COMMUNITY)
Admission: RE | Admit: 2018-06-02 | Discharge: 2018-06-02 | Disposition: A | Discharge status: Home / Self Care | Payer: Commercial Managed Care - PPO | Source: Ambulatory Visit | Attending: Family | Admitting: Family

## 2018-06-02 ENCOUNTER — Telehealth: Payer: Self-pay | Admitting: Family

## 2018-06-02 DIAGNOSIS — R109 Unspecified abdominal pain: Secondary | ICD-10-CM | POA: Diagnosis not present

## 2018-06-02 DIAGNOSIS — R1032 Left lower quadrant pain: Secondary | ICD-10-CM | POA: Diagnosis not present

## 2018-06-02 LAB — CBC WITH DIFFERENTIAL/PLATELET
Basophils Absolute: 0 10*3/uL (ref 0.0–0.2)
Basos: 0 %
EOS (ABSOLUTE): 0 10*3/uL (ref 0.0–0.4)
Eos: 1 %
Hematocrit: 49.3 % (ref 37.5–51.0)
Hemoglobin: 16.1 g/dL (ref 13.0–17.7)
Immature Grans (Abs): 0 10*3/uL (ref 0.0–0.1)
Immature Granulocytes: 0 %
Lymphocytes Absolute: 2.4 10*3/uL (ref 0.7–3.1)
Lymphs: 36 %
MCH: 26.8 pg (ref 26.6–33.0)
MCHC: 32.7 g/dL (ref 31.5–35.7)
MCV: 82 fL (ref 79–97)
Monocytes Absolute: 0.9 10*3/uL (ref 0.1–0.9)
Monocytes: 13 %
Neutrophils Absolute: 3.4 10*3/uL (ref 1.4–7.0)
Neutrophils: 50 %
Platelets: 264 10*3/uL (ref 150–450)
RBC: 6 x10E6/uL — ABNORMAL HIGH (ref 4.14–5.80)
RDW: 12.8 % (ref 12.3–15.4)
WBC: 6.8 10*3/uL (ref 3.4–10.8)

## 2018-06-02 LAB — H PYLORI, IGM, IGG, IGA AB
H pylori, IgM Abs: <9 units (ref 0.0–8.9)
H. pylori, IgA Abs: <9 units (ref 0.0–8.9)
H. pylori, IgG AbS: 0.2 Index Value (ref 0.00–0.79)

## 2018-06-02 LAB — CMP14+EGFR
ALT: 60 IU/L — ABNORMAL HIGH (ref 0–44)
AST: 34 IU/L (ref 0–40)
Albumin/Globulin Ratio: 1.6 (ref 1.2–2.2)
Albumin: 4.8 g/dL (ref 3.5–5.5)
Alkaline Phosphatase: 71 IU/L (ref 39–117)
BUN/Creatinine Ratio: 8 — ABNORMAL LOW (ref 9–20)
BUN: 9 mg/dL (ref 6–24)
Bilirubin Total: 0.8 mg/dL (ref 0.0–1.2)
CO2: 26 mmol/L (ref 20–29)
Calcium: 9.6 mg/dL (ref 8.7–10.2)
Chloride: 100 mmol/L (ref 96–106)
Creatinine, Ser: 1.16 mg/dL (ref 0.76–1.27)
GFR calc Af Amer: 89 mL/min/{1.73_m2} (ref >59)
GFR calc non Af Amer: 77 mL/min/{1.73_m2} (ref >59)
Globulin, Total: 3 g/dL (ref 1.5–4.5)
Glucose: 83 mg/dL (ref 65–99)
Potassium: 4.2 mmol/L (ref 3.5–5.2)
Sodium: 141 mmol/L (ref 134–144)
Total Protein: 7.8 g/dL (ref 6.0–8.5)

## 2018-06-02 MED ORDER — IOHEXOL 300 MG/ML  SOLN: 100.0000 mL | Freq: Once | INTRAMUSCULAR | Status: DC | PRN

## 2018-06-02 MED ORDER — IOPAMIDOL (ISOVUE-300) INJECTION 61%
100.0000 mL | Freq: Once | INTRAVENOUS | Status: AC | PRN
  Administered 2018-06-02: 100 mL via INTRAVENOUS

## 2018-06-02 NOTE — Telephone Encounter (Signed)
Please review CT Results and advise

## 2018-06-02 NOTE — Telephone Encounter (Signed)
It looks like the CT scan shows that he does have a kidney stone in the kidney so that necessarily would not cause pain unless he had passed one that was not seen on the scan.  The CT scan did not show any signs of diverticulitis which was what it looks like she was trying to rule out.

## 2018-06-02 NOTE — Telephone Encounter (Signed)
Pt aware.

## 2018-06-05 ENCOUNTER — Telehealth: Payer: Self-pay | Admitting: Family

## 2018-06-05 DIAGNOSIS — N200 Calculus of kidney: Secondary | ICD-10-CM

## 2018-06-05 MED ORDER — HYDROCODONE-ACETAMINOPHEN 5-325 MG PO TABS: 1.0000 | ORAL_TABLET | Freq: Four times a day (QID) | ORAL | 0 refills | Status: DC | PRN

## 2018-06-05 MED ORDER — TAMSULOSIN HCL 0.4 MG PO CAPS: 0.4000 mg | ORAL_CAPSULE | Freq: Every day | ORAL | 3 refills | Status: DC

## 2018-06-05 NOTE — Telephone Encounter (Signed)
I have sent in a rx of flomax and Norco 5-325 mg. Take the Norco only as needed.

## 2018-06-05 NOTE — Telephone Encounter (Signed)
Wife states husband is suffering with right much pain and tylenol is not helping him.  He needs to work and does not appreciate being told to go to Urgent Care.  He knows he has a kidney stone, and is hoping it will pass soon. Please call.  Wife is very upset.

## 2018-06-05 NOTE — Telephone Encounter (Signed)
Pt aware.

## 2018-10-23 ENCOUNTER — Telehealth: Payer: Self-pay | Admitting: Family

## 2018-10-23 NOTE — Telephone Encounter (Signed)
LM needs CPE with [facility]

## 2018-11-28 ENCOUNTER — Other Ambulatory Visit: Payer: Self-pay | Admitting: Family

## 2018-11-28 DIAGNOSIS — K219 Gastro-esophageal reflux disease without esophagitis: Secondary | ICD-10-CM

## 2018-12-28 ENCOUNTER — Encounter (HOSPITAL_COMMUNITY): Payer: Self-pay | Admitting: Emergency Medicine

## 2018-12-28 ENCOUNTER — Emergency Department (HOSPITAL_COMMUNITY): Payer: 59

## 2018-12-28 ENCOUNTER — Other Ambulatory Visit: Payer: Self-pay

## 2018-12-28 ENCOUNTER — Emergency Department (HOSPITAL_COMMUNITY)
Admission: EM | Admit: 2018-12-28 | Discharge: 2018-12-28 | Disposition: A | Discharge status: Home / Self Care | Payer: 59 | Attending: Emergency Medicine | Admitting: Emergency Medicine

## 2018-12-28 DIAGNOSIS — E119 Type 2 diabetes mellitus without complications: Secondary | ICD-10-CM | POA: Diagnosis not present

## 2018-12-28 DIAGNOSIS — I10 Essential (primary) hypertension: Secondary | ICD-10-CM | POA: Diagnosis not present

## 2018-12-28 DIAGNOSIS — R079 Chest pain, unspecified: Secondary | ICD-10-CM | POA: Diagnosis present

## 2018-12-28 DIAGNOSIS — K2100 Gastro-esophageal reflux disease with esophagitis, without bleeding: Secondary | ICD-10-CM

## 2018-12-28 DIAGNOSIS — R0789 Other chest pain: Secondary | ICD-10-CM | POA: Diagnosis not present

## 2018-12-28 DIAGNOSIS — Z79899 Other long term (current) drug therapy: Secondary | ICD-10-CM | POA: Insufficient documentation

## 2018-12-28 DIAGNOSIS — Z87891 Personal history of nicotine dependence: Secondary | ICD-10-CM | POA: Insufficient documentation

## 2018-12-28 LAB — COMPREHENSIVE METABOLIC PANEL
ALT: 40 U/L (ref 0–44)
AST: 27 U/L (ref 15–41)
Albumin: 4.1 g/dL (ref 3.5–5.0)
Alkaline Phosphatase: 64 U/L (ref 38–126)
Anion gap: 8 (ref 5–15)
BUN: 10 mg/dL (ref 6–20)
CO2: 25 mmol/L (ref 22–32)
Calcium: 9.2 mg/dL (ref 8.9–10.3)
Chloride: 106 mmol/L (ref 98–111)
Creatinine, Ser: 1.07 mg/dL (ref 0.61–1.24)
GFR calc Af Amer: >60 mL/min (ref >60)
GFR calc non Af Amer: >60 mL/min (ref >60)
Glucose, Bld: 140 mg/dL — ABNORMAL HIGH (ref 70–99)
Potassium: 3.7 mmol/L (ref 3.5–5.1)
Sodium: 139 mmol/L (ref 135–145)
Total Bilirubin: 0.8 mg/dL (ref 0.3–1.2)
Total Protein: 7.6 g/dL (ref 6.5–8.1)

## 2018-12-28 LAB — CBC WITH DIFFERENTIAL/PLATELET
Abs Immature Granulocytes: 0.02 10*3/uL (ref 0.00–0.07)
Basophils Absolute: 0 10*3/uL (ref 0.0–0.1)
Basophils Relative: 0 %
Eosinophils Absolute: 0.1 10*3/uL (ref 0.0–0.5)
Eosinophils Relative: 1 %
HCT: 50.7 % (ref 39.0–52.0)
Hemoglobin: 15.9 g/dL (ref 13.0–17.0)
Immature Granulocytes: 0 %
Lymphocytes Relative: 37 %
Lymphs Abs: 2.3 10*3/uL (ref 0.7–4.0)
MCH: 26.7 pg (ref 26.0–34.0)
MCHC: 31.4 g/dL (ref 30.0–36.0)
MCV: 85.1 fL (ref 80.0–100.0)
Monocytes Absolute: 0.6 10*3/uL (ref 0.1–1.0)
Monocytes Relative: 10 %
Neutro Abs: 3.1 10*3/uL (ref 1.7–7.7)
Neutrophils Relative %: 52 %
Platelets: 215 10*3/uL (ref 150–400)
RBC: 5.96 MIL/uL — ABNORMAL HIGH (ref 4.22–5.81)
RDW: 12.6 % (ref 11.5–15.5)
WBC: 6.1 10*3/uL (ref 4.0–10.5)
nRBC: 0 % (ref 0.0–0.2)

## 2018-12-28 LAB — LIPASE, BLOOD: Lipase: 23 U/L (ref 11–51)

## 2018-12-28 LAB — TROPONIN I (HIGH SENSITIVITY)
Troponin I (High Sensitivity): 5 ng/L (ref <18)
Troponin I (High Sensitivity): 4 ng/L (ref <18)

## 2018-12-28 MED ORDER — MORPHINE SULFATE (PF) 4 MG/ML IV SOLN
4.0000 mg | Freq: Once | INTRAVENOUS | Status: AC
  Administered 2018-12-28: 4 mg via INTRAVENOUS
  Filled 2018-12-28: qty 1

## 2018-12-28 MED ORDER — ALUM & MAG HYDROXIDE-SIMETH 200-200-20 MG/5ML PO SUSP
30.0000 mL | Freq: Once | ORAL | Status: AC
  Administered 2018-12-28: 30 mL via ORAL
  Filled 2018-12-28: qty 30

## 2018-12-28 MED ORDER — PANTOPRAZOLE SODIUM 20 MG PO TBEC: 20.0000 mg | DELAYED_RELEASE_TABLET | Freq: Two times a day (BID) | ORAL | 0 refills | Status: DC

## 2018-12-28 MED ORDER — LIDOCAINE VISCOUS HCL 2 % MT SOLN
15.0000 mL | Freq: Once | OROMUCOSAL | Status: AC
  Administered 2018-12-28: 15 mL via ORAL
  Filled 2018-12-28: qty 15

## 2018-12-28 MED ORDER — HYDROMORPHONE HCL 1 MG/ML IJ SOLN
1.0000 mg | Freq: Once | INTRAMUSCULAR | Status: AC
  Administered 2018-12-28: 1 mg via INTRAVENOUS
  Filled 2018-12-28: qty 1

## 2018-12-28 MED ORDER — HYDROCODONE-ACETAMINOPHEN 5-325 MG PO TABS: 1.0000 | ORAL_TABLET | Freq: Four times a day (QID) | ORAL | 0 refills | Status: DC | PRN

## 2018-12-28 MED ORDER — IOHEXOL 350 MG/ML SOLN
100.0000 mL | Freq: Once | INTRAVENOUS | Status: AC | PRN
  Administered 2018-12-28: 100 mL via INTRAVENOUS

## 2018-12-28 NOTE — ED Provider Notes (Signed)
[facility] EMERGENCY DEPARTMENT Provider Note   CSN: [id number] Arrival date & time: 12/28/18  1517     History   Chief Complaint Chief Complaint  Patient presents with  . Chest Pain    HPI Fred Boyd is a 43 y.o. male.     Patient is a 43 year old male with history of GERD.  He presents today for evaluation of abdominal/chest pain.  This began this morning shortly after waking from sleep.  He describes a sharp pain to the left upper abdomen/lower chest just below his chest.  This is a constant pain that is worse when he changes position and moves.  He denies any vomiting or diarrhea.  He denies any difficulty breathing, fever, or cough.  The history is provided by the patient.  Chest Pain Pain location:  L chest Pain quality comment:  Cramping Pain radiates to:  Does not radiate Pain severity:  Moderate Onset quality:  Sudden Duration:  8 hours Timing:  Constant Progression:  Worsening Chronicity:  New Relieved by:  Nothing Worsened by:  Movement and certain positions Ineffective treatments:  None tried   Past Medical History:  Diagnosis Date  . Diabetes mellitus without complication ([place])    Borderline; No longer diabetic  . Hypertension     Patient Active Problem List   Diagnosis Date Noted  . GERD (gastroesophageal reflux disease) 06/23/2017  . Abdominal pain, chronic, epigastric 06/23/2017  . Abnormal CT of the abdomen 06/23/2017    Past Surgical History:  Procedure Laterality Date  . FRACTURE SURGERY    . HAND SURGERY          Home Medications    Prior to Admission medications   Medication Sig Start Date End Date Taking? Authorizing Provider  escitalopram (LEXAPRO) 10 MG tablet Take 10 mg by mouth daily.    [provider]  HYDROcodone-acetaminophen (NORCO/VICODIN) 5-325 MG tablet Take 1 tablet by mouth every 6 (six) hours as needed for moderate pain. 06/05/18   Sharion Balloon, FNP  levocetirizine (XYZAL) 5 MG tablet Take 5 mg by  mouth every evening.    [provider]  omeprazole (PRILOSEC) 20 MG capsule TAKE ONE CAPSULE BY MOUTH TWICE A DAY BEFORE MEALS 11/29/18   Evelina Dun A, FNP  tamsulosin (FLOMAX) 0.4 MG CAPS capsule Take 1 capsule (0.4 mg total) by mouth daily. 06/05/18   Sharion Balloon, FNP    Family History Family History  Problem Relation Age of Onset  . Diabetes Mother   . Hypertension Mother   . Diabetes Father   . Hypertension Father   . Gastric cancer Cousin 53  . Colon cancer Neg Hx   . Esophageal cancer Neg Hx     Social History Social History   Tobacco Use  . Smoking status: Former Smoker    Packs/day: 0.50    Years: 20.00    Pack years: 10.00    Types: Cigarettes    Quit date: 11/12/2017    Years since quitting: 1.1  . Smokeless tobacco: Never Used  Substance Use Topics  . Alcohol use: Yes    Comment: 06/23/17: 2-3 beer/night; cut back in last couple weeks due to symptoms  . Drug use: No     Allergies   Penicillins   Review of Systems Review of Systems  Cardiovascular: Positive for chest pain.  All other systems reviewed and are negative.    Physical Exam Updated Vital Signs BP (!) 147/98 (BP Location: Right Arm)   Pulse  79   Temp 97.8 F (36.6 C) (Oral)   Resp 14   Ht 6' (1.829 m)   Wt 95.3 kg   SpO2 99%   BMI 28.48 kg/m   Physical Exam Vitals signs and nursing note reviewed.  Constitutional:      General: He is not in acute distress.    Appearance: He is well-developed. He is not diaphoretic.  HENT:     Head: Normocephalic and atraumatic.  Neck:     Musculoskeletal: Normal range of motion and neck supple.  Cardiovascular:     Rate and Rhythm: Normal rate and regular rhythm.     Heart sounds: No murmur. No friction rub.  Pulmonary:     Effort: Pulmonary effort is normal. No respiratory distress.     Breath sounds: Normal breath sounds. No wheezing or rales.  Abdominal:     General: Bowel sounds are normal. There is no distension.      Palpations: Abdomen is soft.     Tenderness: There is no abdominal tenderness.     Comments: There is tenderness to palpation to the left upper quadrant and left lower chest.  There is no crepitus.  Musculoskeletal: Normal range of motion.  Skin:    General: Skin is warm and dry.  Neurological:     Mental Status: He is alert and oriented to person, place, and time.     Coordination: Coordination normal.      ED Treatments / Results  Labs (all labs ordered are listed, but only abnormal results are displayed) Labs Reviewed  CBC WITH DIFFERENTIAL/PLATELET - Abnormal; Notable for the following components:      Result Value   RBC 5.96 (*)    All other components within normal limits  COMPREHENSIVE METABOLIC PANEL  LIPASE, BLOOD  TROPONIN I (HIGH SENSITIVITY)    EKG EKG Interpretation  Date/Time:  Thursday December 28 2018 15:24:49 EDT Ventricular Rate:  73 PR Interval:  126 QRS Duration: 86 QT Interval:  372 QTC Calculation: 409 R Axis:   62 Text Interpretation:  Normal sinus rhythm Nonspecific ST and T wave abnormality Abnormal ECG Confirmed by Geoffery LyonseLo, [place] Samford ([id number]) on 12/28/2018 4:06:08 PM   Radiology Dg Chest 2 View  Result Date: 12/28/2018 CLINICAL DATA:  Chest pain EXAM: CHEST - 2 VIEW COMPARISON:  04/27/2018 FINDINGS: The heart size and mediastinal contours are within normal limits. Both lungs are clear. The visualized skeletal structures are unremarkable. IMPRESSION: No active cardiopulmonary disease. Electronically Signed   By: Marlan Palauharles  Clark M.D.   On: 12/28/2018 15:58    Procedures Procedures (including critical care time)  Medications Ordered in ED Medications  alum & mag hydroxide-simeth (MAALOX/MYLANTA) 200-200-20 MG/5ML suspension 30 mL (has no administration in time range)    And  lidocaine (XYLOCAINE) 2 % viscous mouth solution 15 mL (has no administration in time range)     Initial Impression / Assessment and Plan / ED Course  I have reviewed the triage  vital signs and the nursing notes.  Pertinent labs & imaging results that were available during my care of the patient were reviewed by me and considered in my medical decision making (see chart for details).  Patient presenting here with complaints of pain in the left side of his chest and upper abdomen that began this afternoon in the absence of any injury or trauma.  He describes the pain as severe.  Nothing indicates a cardiac etiology including EKG and troponin x2.  CT scan of the chest, abdomen,  and pelvis shows no abnormality with the aorta or vascular structures.  He does have what appears to be some thickening of the distal esophagus, possible esophagitis.  I have found no other cause for his discomfort and will treat this as the presumptive issue.  He will be given Protonix to take in place of his Prilosec.  He will also be given medicine for pain and is to follow-up as needed.  Final Clinical Impressions(s) / ED Diagnoses   Final diagnoses:  None    ED Discharge Orders    None       Geoffery Lyonselo, [place], MD 12/28/18 2157

## 2018-12-28 NOTE — ED Triage Notes (Signed)
Pt states that he woke up this morning with chest pains that goes to his back.

## 2018-12-28 NOTE — Discharge Instructions (Signed)
Stop Prilosec.  Begin taking Protonix as prescribed this evening.  Hydrocodone as prescribed as needed for pain.  Follow-up with your primary doctor if your symptoms or not improving in the next week, and return to the ER if symptoms significantly worsen or change.

## 2018-12-28 NOTE — ED Notes (Signed)
Patient transported to CT 

## 2019-02-15 ENCOUNTER — Other Ambulatory Visit: Payer: Self-pay

## 2019-02-15 DIAGNOSIS — Z20822 Contact with and (suspected) exposure to covid-19: Secondary | ICD-10-CM

## 2019-02-16 LAB — NOVEL CORONAVIRUS, NAA: SARS-CoV-2, NAA: NOT DETECTED

## 2019-04-08 ENCOUNTER — Other Ambulatory Visit: Payer: Self-pay

## 2019-04-08 ENCOUNTER — Emergency Department (HOSPITAL_COMMUNITY): Payer: 59

## 2019-04-08 ENCOUNTER — Encounter (HOSPITAL_COMMUNITY): Payer: Self-pay | Admitting: *Deleted

## 2019-04-08 ENCOUNTER — Emergency Department (HOSPITAL_COMMUNITY)
Admission: EM | Admit: 2019-04-08 | Discharge: 2019-04-08 | Disposition: A | Discharge status: Home / Self Care | Payer: 59 | Attending: Emergency Medicine | Admitting: Emergency Medicine

## 2019-04-08 DIAGNOSIS — Z87891 Personal history of nicotine dependence: Secondary | ICD-10-CM | POA: Diagnosis not present

## 2019-04-08 DIAGNOSIS — I10 Essential (primary) hypertension: Secondary | ICD-10-CM | POA: Insufficient documentation

## 2019-04-08 DIAGNOSIS — Z79899 Other long term (current) drug therapy: Secondary | ICD-10-CM | POA: Diagnosis not present

## 2019-04-08 DIAGNOSIS — E119 Type 2 diabetes mellitus without complications: Secondary | ICD-10-CM | POA: Insufficient documentation

## 2019-04-08 DIAGNOSIS — R1013 Epigastric pain: Secondary | ICD-10-CM

## 2019-04-08 DIAGNOSIS — K859 Acute pancreatitis without necrosis or infection, unspecified: Secondary | ICD-10-CM | POA: Insufficient documentation

## 2019-04-08 HISTORY — DX: Acute pancreatitis without necrosis or infection, unspecified: K85.90

## 2019-04-08 LAB — COMPREHENSIVE METABOLIC PANEL
ALT: 37 U/L (ref 0–44)
AST: 27 U/L (ref 15–41)
Albumin: 4.2 g/dL (ref 3.5–5.0)
Alkaline Phosphatase: 63 U/L (ref 38–126)
Anion gap: 8 (ref 5–15)
BUN: 16 mg/dL (ref 6–20)
CO2: 26 mmol/L (ref 22–32)
Calcium: 8.7 mg/dL — ABNORMAL LOW (ref 8.9–10.3)
Chloride: 102 mmol/L (ref 98–111)
Creatinine, Ser: 1.19 mg/dL (ref 0.61–1.24)
GFR calc Af Amer: >60 mL/min (ref >60)
GFR calc non Af Amer: >60 mL/min (ref >60)
Glucose, Bld: 142 mg/dL — ABNORMAL HIGH (ref 70–99)
Potassium: 3.7 mmol/L (ref 3.5–5.1)
Sodium: 136 mmol/L (ref 135–145)
Total Bilirubin: 0.7 mg/dL (ref 0.3–1.2)
Total Protein: 7.6 g/dL (ref 6.5–8.1)

## 2019-04-08 LAB — CBC WITH DIFFERENTIAL/PLATELET
Abs Immature Granulocytes: 0.02 10*3/uL (ref 0.00–0.07)
Basophils Absolute: 0 10*3/uL (ref 0.0–0.1)
Basophils Relative: 0 %
Eosinophils Absolute: 0.1 10*3/uL (ref 0.0–0.5)
Eosinophils Relative: 1 %
HCT: 45 % (ref 39.0–52.0)
Hemoglobin: 14.2 g/dL (ref 13.0–17.0)
Immature Granulocytes: 0 %
Lymphocytes Relative: 38 %
Lymphs Abs: 2.7 10*3/uL (ref 0.7–4.0)
MCH: 26.9 pg (ref 26.0–34.0)
MCHC: 31.6 g/dL (ref 30.0–36.0)
MCV: 85.4 fL (ref 80.0–100.0)
Monocytes Absolute: 0.7 10*3/uL (ref 0.1–1.0)
Monocytes Relative: 10 %
Neutro Abs: 3.5 10*3/uL (ref 1.7–7.7)
Neutrophils Relative %: 51 %
Platelets: 231 10*3/uL (ref 150–400)
RBC: 5.27 MIL/uL (ref 4.22–5.81)
RDW: 13.1 % (ref 11.5–15.5)
WBC: 7 10*3/uL (ref 4.0–10.5)
nRBC: 0 % (ref 0.0–0.2)

## 2019-04-08 LAB — TROPONIN I (HIGH SENSITIVITY)
Troponin I (High Sensitivity): 4 ng/L (ref <18)
Troponin I (High Sensitivity): 5 ng/L (ref <18)

## 2019-04-08 LAB — URINALYSIS, ROUTINE W REFLEX MICROSCOPIC
Bilirubin Urine: NEGATIVE
Glucose, UA: NEGATIVE mg/dL
Hgb urine dipstick: NEGATIVE
Ketones, ur: NEGATIVE mg/dL
Leukocytes,Ua: NEGATIVE
Nitrite: NEGATIVE
Protein, ur: NEGATIVE mg/dL
Specific Gravity, Urine: 1.004 — ABNORMAL LOW (ref 1.005–1.030)
pH: 7 (ref 5.0–8.0)

## 2019-04-08 LAB — ETHANOL: Alcohol, Ethyl (B): <10 mg/dL (ref <10)

## 2019-04-08 LAB — LIPASE, BLOOD: Lipase: 92 U/L — ABNORMAL HIGH (ref 11–51)

## 2019-04-08 MED ORDER — PANTOPRAZOLE SODIUM 40 MG IV SOLR
40.0000 mg | Freq: Once | INTRAVENOUS | Status: AC
  Administered 2019-04-08: 05:00:00 40 mg via INTRAVENOUS
  Filled 2019-04-08: qty 40

## 2019-04-08 MED ORDER — PANTOPRAZOLE SODIUM 20 MG PO TBEC: 20.0000 mg | DELAYED_RELEASE_TABLET | Freq: Two times a day (BID) | ORAL | 0 refills | Status: DC

## 2019-04-08 MED ORDER — HYDROMORPHONE HCL 1 MG/ML IJ SOLN
1.0000 mg | Freq: Once | INTRAMUSCULAR | Status: AC
  Administered 2019-04-08: 1 mg via INTRAVENOUS
  Filled 2019-04-08: qty 1

## 2019-04-08 MED ORDER — LIDOCAINE VISCOUS HCL 2 % MT SOLN
15.0000 mL | Freq: Once | OROMUCOSAL | Status: AC
  Administered 2019-04-08: 05:00:00 15 mL via ORAL
  Filled 2019-04-08: qty 15

## 2019-04-08 MED ORDER — ALUM & MAG HYDROXIDE-SIMETH 200-200-20 MG/5ML PO SUSP
30.0000 mL | Freq: Once | ORAL | Status: AC
  Administered 2019-04-08: 05:00:00 30 mL via ORAL
  Filled 2019-04-08: qty 30

## 2019-04-08 MED ORDER — SUCRALFATE 1 G PO TABS: 1.0000 g | ORAL_TABLET | Freq: Three times a day (TID) | ORAL | 0 refills | Status: AC

## 2019-04-08 MED ORDER — SUCRALFATE 1 G PO TABS: 1.0000 g | ORAL_TABLET | Freq: Three times a day (TID) | ORAL | 0 refills | Status: DC

## 2019-04-08 MED ORDER — IOHEXOL 350 MG/ML SOLN
100.0000 mL | Freq: Once | INTRAVENOUS | Status: AC | PRN
  Administered 2019-04-08: 100 mL via INTRAVENOUS

## 2019-04-08 MED ORDER — SODIUM CHLORIDE 0.9 % IV BOLUS
1000.0000 mL | Freq: Once | INTRAVENOUS | Status: AC
  Administered 2019-04-08: 05:00:00 1000 mL via INTRAVENOUS

## 2019-04-08 MED ORDER — FENTANYL CITRATE (PF) 100 MCG/2ML IJ SOLN
50.0000 ug | Freq: Once | INTRAMUSCULAR | Status: AC
  Administered 2019-04-08: 50 ug via INTRAVENOUS
  Filled 2019-04-08: qty 2

## 2019-04-08 MED ORDER — ONDANSETRON HCL 4 MG/2ML IJ SOLN
4.0000 mg | Freq: Once | INTRAMUSCULAR | Status: AC
  Administered 2019-04-08: 05:00:00 4 mg via INTRAVENOUS
  Filled 2019-04-08: qty 2

## 2019-04-08 MED ORDER — PANTOPRAZOLE SODIUM 20 MG PO TBEC: 20.0000 mg | DELAYED_RELEASE_TABLET | Freq: Two times a day (BID) | ORAL | 0 refills | Status: AC

## 2019-04-08 NOTE — ED Triage Notes (Signed)
Pt c/o epigastric pain that started tonight, vomiting that started Friday, pt reports that he has had this pain before and was given a gi cocktail with improvement and has also had pancreatitis in the past, admits to drinking two beers Friday,

## 2019-04-08 NOTE — ED Notes (Signed)
Patient taking small sips of gingerale per fluid challenge.

## 2019-04-08 NOTE — Discharge Instructions (Signed)
As we discussed, your testing shows evidence of esophagitis as well as likely pancreatitis.  You declined admission to the hospital. Followup with your doctor as well the stomach doctor for EGD. It was recommended he follow a liquid diet to rest her pancreas as well as continue the Protonix medication to protect your stomach.  Avoid alcohol, caffeine, spicy foods. Return to the ED with worsening pain, inability to eat or drink, fever, any other concerns

## 2019-04-08 NOTE — ED Provider Notes (Signed)
[facility] EMERGENCY DEPARTMENT Provider Note   CSN: [id number] Arrival date & time: 04/08/19  [id number]     History   Chief Complaint Chief Complaint  Patient presents with  . Abdominal Pain    HPI Fred Boyd T Bullard is a 43 y.o. male.     Patient with history of hypertension, diabetes, GERD, pancreatitis presenting with epigastric pain that onset this evening.  He believes he had several episodes of vomiting throughout the day yesterday Friday, October 23 after having several alcoholic drinks.  He did not have the epigastric pain until tonight.  No episodes of vomiting today.  Emesis was "dark" in color.  No blood in the stool.  Patient has had similar pain in the past due to acid reflux as well as pancreatitis.  He describes the pain is sharp and cramping without radiation.  No fever.  No diarrhea.  No chest pain or shortness of breath.  Still has appendix and gallbladder.  Continues to take Protonix for his acid reflux disease.  Denies any excessive alcohol use, NSAIDs, caffeine or spicy foods.  He has never had an EGD.  The history is provided by the patient.  Abdominal Pain Associated symptoms: nausea and vomiting   Associated symptoms: no dysuria, no fever and no hematuria     Past Medical History:  Diagnosis Date  . Diabetes mellitus without complication (HCC)    Borderline; No longer diabetic  . Hypertension   . Pancreatitis     Patient Active Problem List   Diagnosis Date Noted  . GERD (gastroesophageal reflux disease) 06/23/2017  . Abdominal pain, chronic, epigastric 06/23/2017  . Abnormal CT of the abdomen 06/23/2017    Past Surgical History:  Procedure Laterality Date  . FRACTURE SURGERY    . HAND SURGERY          Home Medications    Prior to Admission medications   Medication Sig Start Date End Date Taking? Authorizing Provider  escitalopram (LEXAPRO) 10 MG tablet Take 10 mg by mouth daily as needed (for mood/depression).     [provider]   HYDROcodone-acetaminophen (NORCO) 5-325 MG tablet Take 1-2 tablets by mouth every 6 (six) hours as needed. 12/28/18   Geoffery Lyonselo, Douglas, MD  omeprazole (PRILOSEC) 20 MG capsule TAKE ONE CAPSULE BY MOUTH TWICE A DAY BEFORE MEALS Patient taking differently: Take 20 mg by mouth 2 (two) times daily before a meal.  11/29/18   Hawks, Neysa Bonitohristy A, FNP  pantoprazole (PROTONIX) 20 MG tablet Take 1 tablet (20 mg total) by mouth 2 (two) times daily before a meal. 12/28/18   Geoffery Lyonselo, Douglas, MD    Family History Family History  Problem Relation Age of Onset  . Diabetes Mother   . Hypertension Mother   . Diabetes Father   . Hypertension Father   . Gastric cancer Cousin 37  . Colon cancer Neg Hx   . Esophageal cancer Neg Hx     Social History Social History   Tobacco Use  . Smoking status: Former Smoker    Packs/day: 0.50    Years: 20.00    Pack years: 10.00    Types: Cigarettes    Quit date: 11/12/2017    Years since quitting: 1.4  . Smokeless tobacco: Never Used  Substance Use Topics  . Alcohol use: Yes    Comment: 06/23/17: 2-3 beer/night; cut back in last couple weeks due to symptoms  . Drug use: No     Allergies   Penicillins   Review of  Systems Review of Systems  Constitutional: Positive for activity change and appetite change. Negative for fever.  HENT: Negative for congestion and rhinorrhea.   Eyes: Negative for visual disturbance.  Gastrointestinal: Positive for abdominal pain, nausea and vomiting.  Genitourinary: Negative for dysuria and hematuria.  Musculoskeletal: Negative for arthralgias and myalgias.  Neurological: Negative for dizziness, weakness and headaches.   all other systems are negative except as noted in the HPI and PMH.     Physical Exam Updated Vital Signs BP (!) 145/94   Pulse 72   Temp 98.7 F (37.1 C) (Oral)   Resp 17   Ht 6' (1.829 m)   Wt 95.3 kg   SpO2 100%   BMI 28.48 kg/m   Physical Exam Vitals signs and nursing note reviewed.   Constitutional:      General: He is not in acute distress.    Appearance: He is well-developed.     Comments: Uncomfortable  HENT:     Head: Normocephalic and atraumatic.     Mouth/Throat:     Pharynx: No oropharyngeal exudate.  Eyes:     Conjunctiva/sclera: Conjunctivae normal.     Pupils: Pupils are equal, round, and reactive to light.  Neck:     Musculoskeletal: Normal range of motion and neck supple.     Comments: No meningismus. Cardiovascular:     Rate and Rhythm: Normal rate and regular rhythm.     Heart sounds: Normal heart sounds. No murmur.  Pulmonary:     Effort: Pulmonary effort is normal. No respiratory distress.     Breath sounds: Normal breath sounds.  Abdominal:     Palpations: Abdomen is soft.     Tenderness: There is abdominal tenderness. There is guarding. There is no rebound.     Comments: Epigastric tenderness with guarding.  Musculoskeletal: Normal range of motion.        General: No tenderness.  Skin:    General: Skin is warm.  Neurological:     Mental Status: He is alert and oriented to person, place, and time.     Cranial Nerves: No cranial nerve deficit.     Motor: No abnormal muscle tone.     Coordination: Coordination normal.     Comments:  5/5 strength throughout. CN 2-12 intact.Equal grip strength.   Psychiatric:        Behavior: Behavior normal.      ED Treatments / Results  Labs (all labs ordered are listed, but only abnormal results are displayed) Labs Reviewed  COMPREHENSIVE METABOLIC PANEL - Abnormal; Notable for the following components:      Result Value   Glucose, Bld 142 (*)    Calcium 8.7 (*)    All other components within normal limits  LIPASE, BLOOD - Abnormal; Notable for the following components:   Lipase 92 (*)    All other components within normal limits  CBC WITH DIFFERENTIAL/PLATELET  ETHANOL  URINALYSIS, ROUTINE W REFLEX MICROSCOPIC  TROPONIN I (HIGH SENSITIVITY)  TROPONIN I (HIGH SENSITIVITY)    EKG EKG  Interpretation  Date/Time:  Sunday April 08 2019 03:41:15 EDT Ventricular Rate:  69 PR Interval:    QRS Duration: 91 QT Interval:  386 QTC Calculation: 414 R Axis:   57 Text Interpretation:  Sinus rhythm Probable left atrial enlargement Minimal ST elevation, anterior leads Baseline wander in lead(s) II III aVF No significant change was found Confirmed by Glynn Octave [phone]) on 04/08/2019 3:55:15 AM   Radiology Dg Abdomen Acute W/chest  Result Date:  04/08/2019 CLINICAL DATA:  Epigastric pain and vomiting. EXAM: DG ABDOMEN ACUTE W/ 1V CHEST COMPARISON:  None. FINDINGS: There is no evidence of dilated bowel loops or free intraperitoneal air. No radiopaque calculi or other significant radiographic abnormality is seen. Heart size and mediastinal contours are within normal limits. Both lungs are clear. IMPRESSION: Negative abdominal radiographs.  No acute cardiopulmonary disease. Electronically Signed   By: Kerby Moors M.D.   On: 04/08/2019 05:31   Ct Angio Chest/abd/pel For Dissection W And/or Wo Contrast  Result Date: 04/08/2019 CLINICAL DATA:  Chest pain. Acute aortic syndrome suspected. EXAM: CT ANGIOGRAPHY CHEST, ABDOMEN AND PELVIS TECHNIQUE: Multidetector CT imaging through the chest, abdomen and pelvis was performed using the standard protocol during bolus administration of intravenous contrast. Multiplanar reconstructed images and MIPs were obtained and reviewed to evaluate the vascular anatomy. CONTRAST:  171mL OMNIPAQUE IOHEXOL 350 MG/ML SOLN COMPARISON:  12/28/2018 FINDINGS: CTA CHEST FINDINGS Cardiovascular: Preferential opacification of the thoracic aorta. No evidence of thoracic aortic aneurysm or dissection. Normal heart size. No pericardial effusion. Mediastinum/Nodes: No enlarged mediastinal, hilar, or axillary lymph nodes. Similar appearance chronic mild circumferential wall thickening of the distal esophagus. Normal appearance of the thyroid gland. The trachea appears  patent and is midline. Lungs/Pleura: Lungs are clear. No pleural effusion or pneumothorax. Musculoskeletal: No chest wall abnormality. No acute or significant osseous findings. Review of the MIP images confirms the above findings. CTA ABDOMEN AND PELVIS FINDINGS VASCULAR Aorta: Normal caliber aorta without aneurysm, dissection, vasculitis or significant stenosis. Celiac: Patent without evidence of aneurysm, dissection, vasculitis or significant stenosis. SMA: Patent without evidence of aneurysm, dissection, vasculitis or significant stenosis. Renals: Both renal arteries are patent without evidence of aneurysm, dissection, vasculitis, fibromuscular dysplasia or significant stenosis. IMA: Patent without evidence of aneurysm, dissection, vasculitis or significant stenosis. Inflow: Patent without evidence of aneurysm, dissection, vasculitis or significant stenosis. Veins: No obvious venous abnormality within the limitations of this arterial phase study. Review of the MIP images confirms the above findings. NON-VASCULAR Hepatobiliary: Again seen are small low density foci within the inferior right lobe of liver. These measure less than 1 cm and are too small to reliably characterize. The gallbladder is unremarkable. Pancreas: Unremarkable. No pancreatic ductal dilatation or surrounding inflammatory changes. Spleen: Normal in size without focal abnormality. Adrenals/Urinary Tract: Adrenal glands are unremarkable. Kidneys are normal, without renal calculi, focal lesion, or hydronephrosis. Bladder is unremarkable. Stomach/Bowel: Stomach is within normal limits. Appendix appears normal. No evidence of bowel wall thickening, distention, or inflammatory changes. Lymphatic: No significant vascular findings are present. No enlarged abdominal or pelvic lymph nodes. Reproductive: Prostate is unremarkable. Other: No abdominal wall hernia or abnormality. No abdominopelvic ascites. Musculoskeletal: No acute or significant osseous  findings. Review of the MIP images confirms the above findings. IMPRESSION: 1. No evidence for acute aortic syndrome. 2. No acute findings within the chest, abdomen or pelvis. 3. Similar appearance of circumferential wall thickening of the distal esophagus which may be the sequelae of chronic esophagitis. Aortic Atherosclerosis (ICD10-I70.0). Electronically Signed   By: Kerby Moors M.D.   On: 04/08/2019 07:15    Procedures Procedures (including critical care time)  Medications Ordered in ED Medications  fentaNYL (SUBLIMAZE) injection 50 mcg (has no administration in time range)  pantoprazole (PROTONIX) injection 40 mg (40 mg Intravenous Given 04/08/19 0440)  sodium chloride 0.9 % bolus 1,000 mL (1,000 mLs Intravenous New Bag/Given 04/08/19 0439)  ondansetron (ZOFRAN) injection 4 mg (4 mg Intravenous Given 04/08/19 0440)  alum & mag  hydroxide-simeth (MAALOX/MYLANTA) 200-200-20 MG/5ML suspension 30 mL (30 mLs Oral Given 04/08/19 0440)    And  lidocaine (XYLOCAINE) 2 % viscous mouth solution 15 mL (15 mLs Oral Given 04/08/19 0440)     Initial Impression / Assessment and Plan / ED Course  I have reviewed the triage vital signs and the nursing notes.  Pertinent labs & imaging results that were available during my care of the patient were reviewed by me and considered in my medical decision making (see chart for details).       Epigastric pain with nausea and vomiting in setting of alcohol use.  Similar to previous episodes.  EKG without acute ischemia.  Patient given symptom control and IV fluids.  Labs will be obtained  EKG without acute ischemia.  Troponin is negative.  Lipase mildly elevated 92.  LFTs are normal.  Patient given pain and nausea control as well as IV PPI.  He states he is not feeling any better but does not want to be admitted.  He will be given a p.o. challenge.  Troponin negative x2 with low suspicion for ACS. CT scan shows thickening of his esophagus without  evidence of acute pancreatitis by imaging.  Discussed with patient to continue PPI, add Carafate, avoid alcohol, caffeine, NSAIDs, spicy foods. Will need to follow-up with both gastroenterology and his PCP.  We will ensure he is able to tolerate p.o. and have his pain and nausea control before discharge. Final Clinical Impressions(s) / ED Diagnoses   Final diagnoses:  Epigastric pain  Acute pancreatitis, unspecified complication status, unspecified pancreatitis type    ED Discharge Orders    None       Suzette Flagler, Jeannett Senior, MD 04/08/19 [id number]

## 2019-11-26 ENCOUNTER — Ambulatory Visit
Admission: EM | Admit: 2019-11-26 | Discharge: 2019-11-26 | Disposition: A | Discharge status: Home / Self Care | Payer: Managed Care, Other (non HMO) | Attending: Emergency Medicine | Admitting: Emergency Medicine

## 2019-11-26 ENCOUNTER — Encounter: Payer: Self-pay | Admitting: Emergency Medicine

## 2019-11-26 DIAGNOSIS — R52 Pain, unspecified: Secondary | ICD-10-CM | POA: Diagnosis not present

## 2019-11-26 DIAGNOSIS — R5383 Other fatigue: Secondary | ICD-10-CM | POA: Diagnosis present

## 2019-11-26 DIAGNOSIS — Z1152 Encounter for screening for COVID-19: Secondary | ICD-10-CM | POA: Diagnosis present

## 2019-11-26 HISTORY — DX: Gastro-esophageal reflux disease without esophagitis: K21.9

## 2019-11-26 LAB — POCT URINALYSIS DIP (MANUAL ENTRY)
Bilirubin, UA: NEGATIVE
Blood, UA: NEGATIVE
Glucose, UA: NEGATIVE mg/dL
Leukocytes, UA: NEGATIVE
Nitrite, UA: NEGATIVE
Protein Ur, POC: 30 mg/dL — AB
Spec Grav, UA: 1.025 (ref 1.010–1.025)
Urobilinogen, UA: 1 E.U./dL
pH, UA: 7 (ref 5.0–8.0)

## 2019-11-26 LAB — POCT FASTING CBG KUC MANUAL ENTRY: POCT Glucose (KUC): 101 mg/dL — AB (ref 70–99)

## 2019-11-26 MED ORDER — IBUPROFEN 800 MG PO TABS
800.0000 mg | ORAL_TABLET | Freq: Three times a day (TID) | ORAL | 0 refills | Status: AC
Start: 2019-11-26 — End: ?

## 2019-11-26 NOTE — ED Provider Notes (Signed)
Fred Boyd   [id number] 11/26/19 Arrival Time: 1815   Chief Complaint  Patient presents with  . Weakness     SUBJECTIVE: History from: patient.  Fred Boyd is a 44 y.o. male who presents with a complaint of body aches, headaches and fatigue for the past 1 to 2 days.  Denies sick exposure to COVID, flu or strep.  Denies recent travel.  Has not tried any medication.  Nothing made his symptoms worse.  Denies previous symptoms in the past.   Denies fever, chills, fatigue, sinus pain, rhinorrhea, sore throat, SOB, wheezing, chest pain, nausea, changes in bowel or bladder habits.         Received flu shot this year: no.  ROS: As per HPI.  All other pertinent ROS negative.     Past Medical History:  Diagnosis Date  . Diabetes mellitus without complication ([place])    Borderline; No longer diabetic  . GERD (gastroesophageal reflux disease)   . Pancreatitis    Past Surgical History:  Procedure Laterality Date  . FRACTURE SURGERY    . HAND SURGERY     Allergies  Allergen Reactions  . Penicillins Itching    Has patient had a PCN reaction causing immediate rash, facial/tongue/throat swelling, SOB or lightheadedness with hypotension: No Has patient had a PCN reaction causing severe rash involving mucus membranes or skin necrosis: No Has patient had a PCN reaction that required hospitalization: No Has patient had a PCN reaction occurring within the last 10 years: No If all of the above answers are "NO", then may proceed with Cephalosporin use.    No current facility-administered medications on file prior to encounter.   Current Outpatient Medications on File Prior to Encounter  Medication Sig Dispense Refill  . escitalopram (LEXAPRO) 10 MG tablet Take 10 mg by mouth daily as needed (for mood/depression).     Marland Kitchen HYDROcodone-acetaminophen (NORCO) 5-325 MG tablet Take 1-2 tablets by mouth every 6 (six) hours as needed. 12 tablet 0  . omeprazole (PRILOSEC) 20 MG capsule  TAKE ONE CAPSULE BY MOUTH TWICE A DAY BEFORE MEALS (Patient taking differently: Take 20 mg by mouth 2 (two) times daily before a meal. ) 180 capsule 1  . pantoprazole (PROTONIX) 20 MG tablet Take 1 tablet (20 mg total) by mouth 2 (two) times daily. 60 tablet 0  . sucralfate (CARAFATE) 1 g tablet Take 1 tablet (1 g total) by mouth 4 (four) times daily -  with meals and at bedtime. 30 tablet 0   Social History   Socioeconomic History  . Marital status: Married    Spouse name: Not on file  . Number of children: Not on file  . Years of education: Not on file  . Highest education level: Not on file  Occupational History  . Not on file  Tobacco Use  . Smoking status: Current Some Day Smoker    Packs/day: 0.50    Years: 20.00    Pack years: 10.00    Types: Cigarettes    Last attempt to quit: 11/12/2017    Years since quitting: 2.0  . Smokeless tobacco: Never Used  Vaping Use  . Vaping Use: Never used  Substance and Sexual Activity  . Alcohol use: Yes    Comment: weekends   . Drug use: No  . Sexual activity: Yes  Other Topics Concern  . Not on file  Social History Narrative  . Not on file   Social Determinants of Health   Financial Resource Strain:   .  Difficulty of Paying Living Expenses:   Food Insecurity:   . Worried About Programme researcher, broadcasting/film/video in the Last Year:   . Barista in the Last Year:   Transportation Needs:   . Freight forwarder (Medical):   Marland Kitchen Lack of Transportation (Non-Medical):   Physical Activity:   . Days of Exercise per Week:   . Minutes of Exercise per Session:   Stress:   . Feeling of Stress :   Social Connections:   . Frequency of Communication with Friends and Family:   . Frequency of Social Gatherings with Friends and Family:   . Attends Religious Services:   . Active Member of Clubs or Organizations:   . Attends Banker Meetings:   Marland Kitchen Marital Status:   Intimate Partner Violence:   . Fear of Current or Ex-Partner:   .  Emotionally Abused:   Marland Kitchen Physically Abused:   . Sexually Abused:    Family History  Problem Relation Age of Onset  . Diabetes Mother   . Hypertension Mother   . Diabetes Father   . Hypertension Father   . Gastric cancer Cousin 37  . Colon cancer Neg Hx   . Esophageal cancer Neg Hx     OBJECTIVE:  Vitals:   11/26/19 1820 11/26/19 1842  BP: 138/80   Pulse: 67   Resp: 16   Temp: 98.2 F (36.8 C)   TempSrc: Oral   SpO2: 98%   Weight:  198 lb (89.8 kg)  Height:  6' (1.829 m)     General appearance: alert; appears fatigued, but nontoxic; speaking in full sentences and tolerating own secretions HEENT: NCAT; Ears: EACs clear, TMs pearly gray; Eyes: PERRL.  EOM grossly intact. Sinuses: nontender; Nose: nares patent without rhinorrhea, Throat: oropharynx clear, tonsils non erythematous or enlarged, uvula midline  Neck: supple without LAD Lungs: unlabored respirations, symmetrical air entry; cough: absent; no respiratory distress; CTAB Heart: regular rate and rhythm.  Radial pulses 2+ symmetrical bilaterally Skin: warm and dry Psychological: alert and cooperative; normal mood and affect  LABS:  Results for orders placed or performed during the hospital encounter of 11/26/19 (from the past 24 hour(s))  POCT CBG (manual entry)     Status: Abnormal   Collection Time: 11/26/19  7:02 PM  Result Value Ref Range   POCT Glucose ([facility]) 101 (A) 70 - 99 mg/dL  POCT urinalysis dipstick     Status: Abnormal   Collection Time: 11/26/19  7:03 PM  Result Value Ref Range   Color, UA yellow yellow   Clarity, UA clear clear   Glucose, UA negative negative mg/dL   Bilirubin, UA negative negative   Ketones, POC UA trace (5) (A) negative mg/dL   Spec Grav, UA 8.416 6.063 - 1.025   Blood, UA negative negative   pH, UA 7.0 5.0 - 8.0   Protein Ur, POC =30 (A) negative mg/dL   Urobilinogen, UA 1.0 0.2 or 1.0 E.U./dL   Nitrite, UA Negative Negative   Leukocytes, UA Negative Negative      ASSESSMENT & PLAN:  1. Encounter for screening for COVID-19   2. Fatigue, unspecified type   3. Body aches     Meds ordered this encounter  Medications  . ibuprofen (ADVIL) 800 MG tablet    Sig: Take 1 tablet (800 mg total) by mouth 3 (three) times daily.    Dispense:  21 tablet    Refill:  0   Patient is stable at  discharge.  Was advised to go to ED for further evaluation.  Patient declined.  Discharge instructions COVID testing ordered.  It will take between 2-7 days for test results.  Someone will contact you regarding abnormal results.    In the meantime: You should remain isolated in your home for 10 days from symptom onset AND greater than 24 hours after symptoms resolution (absence of fever without the use of fever-reducing medication and improvement in respiratory symptoms), whichever is longer Get plenty of rest and push fluids Use medications daily for symptom relief Use OTC medications like ibuprofen or tylenol as needed fever or pain Call or go to the ED if you have any new or worsening symptoms such as fever, worsening cough, shortness of breath, chest tightness, chest pain, turning blue, changes in mental status, etc...   Reviewed expectations re: course of current medical issues. Questions answered. Outlined signs and symptoms indicating need for more acute intervention. Patient verbalized understanding. After Visit Summary given.         Durward Parcel, FNP 11/26/19 1920

## 2019-11-26 NOTE — Discharge Instructions (Signed)

## 2019-11-26 NOTE — ED Triage Notes (Addendum)
Pt states he woke up Sunday and felt like he was dehydrated because he sweated a lot on Saturday. Pt states he feels worse today, c/o leg pain/ aching.  Would like covid test.  Pt states he is able to keep liquids down, denies diarrhea or vomiting, does c/o nausea that started today while at urgent care.

## 2019-11-27 LAB — SARS-COV-2, NAA 2 DAY TAT

## 2019-11-27 LAB — URINE CULTURE: Culture: <10000 — AB

## 2019-11-27 LAB — NOVEL CORONAVIRUS, NAA: SARS-CoV-2, NAA: NOT DETECTED

## 2019-12-24 ENCOUNTER — Emergency Department (HOSPITAL_COMMUNITY)
Admission: EM | Admit: 2019-12-24 | Discharge: 2019-12-24 | Disposition: A | Discharge status: Home / Self Care | Payer: Managed Care, Other (non HMO) | Attending: Emergency Medicine | Admitting: Emergency Medicine

## 2019-12-24 ENCOUNTER — Other Ambulatory Visit: Payer: Self-pay

## 2019-12-24 ENCOUNTER — Emergency Department (HOSPITAL_COMMUNITY): Payer: Managed Care, Other (non HMO)

## 2019-12-24 DIAGNOSIS — F17210 Nicotine dependence, cigarettes, uncomplicated: Secondary | ICD-10-CM | POA: Insufficient documentation

## 2019-12-24 DIAGNOSIS — R42 Dizziness and giddiness: Secondary | ICD-10-CM | POA: Diagnosis not present

## 2019-12-24 DIAGNOSIS — Z79899 Other long term (current) drug therapy: Secondary | ICD-10-CM | POA: Insufficient documentation

## 2019-12-24 DIAGNOSIS — R55 Syncope and collapse: Secondary | ICD-10-CM | POA: Diagnosis not present

## 2019-12-24 DIAGNOSIS — E119 Type 2 diabetes mellitus without complications: Secondary | ICD-10-CM | POA: Diagnosis not present

## 2019-12-24 LAB — COMPREHENSIVE METABOLIC PANEL
ALT: 26 U/L (ref 0–44)
AST: 19 U/L (ref 15–41)
Albumin: 4 g/dL (ref 3.5–5.0)
Alkaline Phosphatase: 59 U/L (ref 38–126)
Anion gap: 9 (ref 5–15)
BUN: 12 mg/dL (ref 6–20)
CO2: 24 mmol/L (ref 22–32)
Calcium: 9.1 mg/dL (ref 8.9–10.3)
Chloride: 106 mmol/L (ref 98–111)
Creatinine, Ser: 1.17 mg/dL (ref 0.61–1.24)
GFR calc Af Amer: >60 mL/min (ref >60)
GFR calc non Af Amer: >60 mL/min (ref >60)
Glucose, Bld: 114 mg/dL — ABNORMAL HIGH (ref 70–99)
Potassium: 4 mmol/L (ref 3.5–5.1)
Sodium: 139 mmol/L (ref 135–145)
Total Bilirubin: 0.7 mg/dL (ref 0.3–1.2)
Total Protein: 7.8 g/dL (ref 6.5–8.1)

## 2019-12-24 LAB — CBC WITH DIFFERENTIAL/PLATELET
Abs Immature Granulocytes: 0.02 10*3/uL (ref 0.00–0.07)
Basophils Absolute: 0 10*3/uL (ref 0.0–0.1)
Basophils Relative: 0 %
Eosinophils Absolute: 0.1 10*3/uL (ref 0.0–0.5)
Eosinophils Relative: 1 %
HCT: 49.1 % (ref 39.0–52.0)
Hemoglobin: 15.7 g/dL (ref 13.0–17.0)
Immature Granulocytes: 0 %
Lymphocytes Relative: 42 %
Lymphs Abs: 2.8 10*3/uL (ref 0.7–4.0)
MCH: 27.1 pg (ref 26.0–34.0)
MCHC: 32 g/dL (ref 30.0–36.0)
MCV: 84.7 fL (ref 80.0–100.0)
Monocytes Absolute: 0.6 10*3/uL (ref 0.1–1.0)
Monocytes Relative: 9 %
Neutro Abs: 3.2 10*3/uL (ref 1.7–7.7)
Neutrophils Relative %: 48 %
Platelets: 225 10*3/uL (ref 150–400)
RBC: 5.8 MIL/uL (ref 4.22–5.81)
RDW: 13 % (ref 11.5–15.5)
WBC: 6.7 10*3/uL (ref 4.0–10.5)
nRBC: 0 % (ref 0.0–0.2)

## 2019-12-24 LAB — CBG MONITORING, ED: Glucose-Capillary: 121 mg/dL — ABNORMAL HIGH (ref 70–99)

## 2019-12-24 LAB — TROPONIN I (HIGH SENSITIVITY)
Troponin I (High Sensitivity): 4 ng/L (ref <18)
Troponin I (High Sensitivity): 4 ng/L (ref <18)

## 2019-12-24 MED ORDER — ONDANSETRON HCL 4 MG/2ML IJ SOLN
4.0000 mg | Freq: Once | INTRAMUSCULAR | Status: AC
  Administered 2019-12-24: 4 mg via INTRAVENOUS
  Filled 2019-12-24: qty 2

## 2019-12-24 MED ORDER — SODIUM CHLORIDE 0.9 % IV BOLUS
1000.0000 mL | Freq: Once | INTRAVENOUS | Status: AC
  Administered 2019-12-24: 1000 mL via INTRAVENOUS

## 2019-12-24 MED ORDER — MECLIZINE HCL 12.5 MG PO TABS
25.0000 mg | ORAL_TABLET | Freq: Once | ORAL | Status: AC
  Administered 2019-12-24: 25 mg via ORAL
  Filled 2019-12-24: qty 2

## 2019-12-24 NOTE — ED Triage Notes (Signed)
Pt here after passing out while getting ready for work this morning. LOC witnessed by wife. Pt has been c/o dizziness and has been seen by PCP for this and prescribed medication.

## 2019-12-24 NOTE — ED Provider Notes (Signed)
[facility] EMERGENCY DEPARTMENT Provider Note   CSN: [id number] Arrival date & time: 12/24/19  0545   Time seen 6:00 AM  History Chief Complaint  Patient presents with  . Loss of Consciousness    Fred Boyd is a 44 y.o. male.  HPI   Patient states he normally gets up at 2:30 in the morning to go to work.  He states when he woke up today at 230 he felt dizzy which means he felt woozy.  He states he laid back down.  About an hour prior to arrival he got up again and his wife heard him fall in the hall.  When she went out there he was unconscious for about a minute and then he started making eye contact.  She did not notice any jerking or shaking when he was on the floor.  She states he had some shaking when he tried to get up.  Patient states he got dizzy and he got nauseated.  He also complains of a frontal aching headache.  He denies any visual change but states if he moves his eyes too fast he gets increasing dizziness.  He denies chest pain, abdominal pain, or diaphoresis.Marland Kitchen.  He states he does get nauseated but has not had vomiting.  He did not have any urinary incontinence.  He states he still feels woozy.  He was seen by his doctor on July 7 and they stopped his Lexapro and started him on Zoloft.  He takes his Zoloft in the morning.  He states he did take it this morning at 230 but he was already feeling bad when he took it.  Wife states he had similar symptoms about 3 months ago and was seen by his primary care doctor in [place].  She says he had a head CT done at that time.  He was treated with meclizine without improvement and then was started on a antidepressive/antianxiety medication which was stopped on July 7 and he was started on Lexapro for the dizziness.  Wife states he had a syncopal episode while vomiting a few months ago.  He was not seen by physician at that time.  PCP Richardean Chimeraaniel, Terry G, MD   Past Medical History:  Diagnosis Date  . Diabetes mellitus without complication  (HCC)    Borderline; No longer diabetic  . GERD (gastroesophageal reflux disease)   . Pancreatitis     Patient Active Problem List   Diagnosis Date Noted  . GERD (gastroesophageal reflux disease) 06/23/2017  . Abdominal pain, chronic, epigastric 06/23/2017  . Abnormal CT of the abdomen 06/23/2017    Past Surgical History:  Procedure Laterality Date  . FRACTURE SURGERY    . HAND SURGERY         Family History  Problem Relation Age of Onset  . Diabetes Mother   . Hypertension Mother   . Diabetes Father   . Hypertension Father   . Gastric cancer Cousin 37  . Colon cancer Neg Hx   . Esophageal cancer Neg Hx     Social History   Tobacco Use  . Smoking status: Current Some Day Smoker    Packs/day: 0.50    Years: 20.00    Pack years: 10.00    Types: Cigarettes    Last attempt to quit: 11/12/2017    Years since quitting: 2.1  . Smokeless tobacco: Never Used  Vaping Use  . Vaping Use: Never used  Substance Use Topics  . Alcohol use: Yes  Comment: weekends   . Drug use: No  lives with spouse employed  Home Medications Prior to Admission medications   Medication Sig Start Date End Date Taking? Authorizing Provider  escitalopram (LEXAPRO) 10 MG tablet Take 10 mg by mouth daily as needed (for mood/depression).     [provider]  HYDROcodone-acetaminophen (NORCO) 5-325 MG tablet Take 1-2 tablets by mouth every 6 (six) hours as needed. 12/28/18   Geoffery Lyons, MD  ibuprofen (ADVIL) 800 MG tablet Take 1 tablet (800 mg total) by mouth 3 (three) times daily. 11/26/19   Avegno, Zachery Dakins, FNP  omeprazole (PRILOSEC) 20 MG capsule TAKE ONE CAPSULE BY MOUTH TWICE A DAY BEFORE MEALS Patient taking differently: Take 20 mg by mouth 2 (two) times daily before a meal.  11/29/18   Hawks, Neysa Bonito A, FNP  pantoprazole (PROTONIX) 20 MG tablet Take 1 tablet (20 mg total) by mouth 2 (two) times daily. 04/08/19   Eber Hong, MD  sucralfate (CARAFATE) 1 g tablet Take 1 tablet  (1 g total) by mouth 4 (four) times daily -  with meals and at bedtime. 04/08/19   Eber Hong, MD    Allergies    Penicillins  Review of Systems   Review of Systems  All other systems reviewed and are negative.   Physical Exam Updated Vital Signs BP (!) 147/100   Pulse 62   Temp 98.1 F (36.7 C) (Oral)   Resp (!) 21   Ht 6' (1.829 m)   Wt 89.4 kg   SpO2 100%   BMI 26.72 kg/m   Physical Exam Vitals and nursing note reviewed.  Constitutional:      Appearance: Normal appearance. He is normal weight.  HENT:     Head: Normocephalic and atraumatic.     Right Ear: External ear normal.     Left Ear: External ear normal.  Eyes:     Extraocular Movements: Extraocular movements intact.     Conjunctiva/sclera: Conjunctivae normal.     Pupils: Pupils are equal, round, and reactive to light.     Comments: When patient looks from left to right or right to left he states the dizziness gets worse.  There may be 1 or 2 beat of nystagmus present.  Cardiovascular:     Rate and Rhythm: Regular rhythm. Bradycardia present.  Pulmonary:     Effort: Pulmonary effort is normal.     Breath sounds: Normal breath sounds.  Abdominal:     General: Abdomen is flat. Bowel sounds are normal.     Palpations: Abdomen is soft.  Musculoskeletal:        General: Normal range of motion.     Cervical back: Normal range of motion.  Skin:    General: Skin is warm and dry.  Neurological:     General: No focal deficit present.     Mental Status: He is alert and oriented to person, place, and time.     Cranial Nerves: No cranial nerve deficit.  Psychiatric:        Mood and Affect: Affect is flat.        Speech: Speech is delayed.        Behavior: Behavior is slowed.    Orthostatic VS for the past 24 hrs:  BP- Lying Pulse- Lying BP- Sitting Pulse- Sitting BP- Standing at 0 minutes Pulse- Standing at 0 minutes  12/24/19 0621 (!) 155/91 53 (!) 145/100 55 (!) 136/93 59    Orthostatics normal  ED  Results / Procedures /  Treatments   Labs (all labs ordered are listed, but only abnormal results are displayed) Results for orders placed or performed during the hospital encounter of 12/24/19  Comprehensive metabolic panel  Result Value Ref Range   Sodium 139 135 - 145 mmol/L   Potassium 4.0 3.5 - 5.1 mmol/L   Chloride 106 98 - 111 mmol/L   CO2 24 22 - 32 mmol/L   Glucose, Bld 114 (H) 70 - 99 mg/dL   BUN 12 6 - 20 mg/dL   Creatinine, Ser 1.85 0.61 - 1.24 mg/dL   Calcium 9.1 8.9 - 63.1 mg/dL   Total Protein 7.8 6.5 - 8.1 g/dL   Albumin 4.0 3.5 - 5.0 g/dL   AST 19 15 - 41 U/L   ALT 26 0 - 44 U/L   Alkaline Phosphatase 59 38 - 126 U/L   Total Bilirubin 0.7 0.3 - 1.2 mg/dL   GFR calc non Af Amer >60 >60 mL/min   GFR calc Af Amer >60 >60 mL/min   Anion gap 9 5 - 15  CBC with Differential  Result Value Ref Range   WBC 6.7 4.0 - 10.5 K/uL   RBC 5.80 4.22 - 5.81 MIL/uL   Hemoglobin 15.7 13.0 - 17.0 g/dL   HCT 49.7 39 - 52 %   MCV 84.7 80.0 - 100.0 fL   MCH 27.1 26.0 - 34.0 pg   MCHC 32.0 30.0 - 36.0 g/dL   RDW 02.6 37.8 - 58.8 %   Platelets 225 150 - 400 K/uL   nRBC 0.0 0.0 - 0.2 %   Neutrophils Relative % 48 %   Neutro Abs 3.2 1.7 - 7.7 K/uL   Lymphocytes Relative 42 %   Lymphs Abs 2.8 0.7 - 4.0 K/uL   Monocytes Relative 9 %   Monocytes Absolute 0.6 0 - 1 K/uL   Eosinophils Relative 1 %   Eosinophils Absolute 0.1 0 - 0 K/uL   Basophils Relative 0 %   Basophils Absolute 0.0 0 - 0 K/uL   Immature Granulocytes 0 %   Abs Immature Granulocytes 0.02 0.00 - 0.07 K/uL  CBG monitoring, ED  Result Value Ref Range   Glucose-Capillary 121 (H) 70 - 99 mg/dL  Troponin I (High Sensitivity)  Result Value Ref Range   Troponin I (High Sensitivity) 4 <18 ng/L   Laboratory interpretation all normal except hyperglycemia    EKG EKG Interpretation  Date/Time:  Monday December 24 2019 05:57:53 EDT Ventricular Rate:  51 PR Interval:    QRS Duration: 87 QT Interval:  421 QTC  Calculation: 388 R Axis:   61 Text Interpretation: Sinus rhythm Probable left atrial enlargement ST elev, probable normal early repol pattern Since last tracing rate slower 08 Apr 2019 Confirmed by Devoria Albe [phone]) on 12/24/2019 6:03:03 AM   Radiology No results found.  Procedures Procedures (including critical care time)  Medications Ordered in ED Medications  sodium chloride 0.9 % bolus 1,000 mL (1,000 mLs Intravenous New Bag/Given 12/24/19 0652)  ondansetron (ZOFRAN) injection 4 mg (4 mg Intravenous Given 12/24/19 4128)  meclizine (ANTIVERT) tablet 25 mg (25 mg Oral Given 12/24/19 7867)    ED Course  I have reviewed the triage vital signs and the nursing notes.  Pertinent labs & imaging results that were available during my care of the patient were reviewed by me and considered in my medical decision making (see chart for details).    MDM Rules/Calculators/A&P  Patient's heart rate during my exam was 55 and when I went to examine him it went up to 61.  When he started moving his eyes around his heart rate dropped down to 55 and he states he was feeling nauseated again.  I suspect he is having vertigo.  When I look in his chart I cannot see his CT scan from 3 months ago or any of his doctors notes from [place].  Patient was given IV fluids 1000 cc of normal saline and Zofran IV and oral meclizine.  CT scan was ordered since he had syncope and now has headache.  Recheck at 7:15 AM patient is getting his IV fluids.  He has not had a CT yet.  He states when he moves his head or his eyes his symptoms are not as bad now.  We discussed being evaluated by Dr.Teoh, ENT, since he has had this dizziness off and on for the past 3 months and he is agreeable.  He states he does not want to be admitted to the hospital.   Dr. Juleen China took over patient at change of shift to get results of a CT scan and to make sure patient is feeling better.  His delta troponin is pending  otherwise his lab work is normal.  Final Clinical Impression(s) / ED Diagnoses Final diagnoses:  Syncope and collapse  Dizziness  Vertigo    Rx / DC Orders  Disposition pending  Devoria Albe, MD, Armando GangDevoria Albe, MD 12/24/19 [phone]

## 2019-12-24 NOTE — ED Notes (Signed)
ED Provider at bedside. 

## 2019-12-24 NOTE — Discharge Instructions (Addendum)
The CT scan of your head does not show any acute abnormalities and your lab work is fine. You may potentially have a problem with your inner ear that is causing your symptoms.   Please consider following up with Dr.Teoh, ENT, about your dizziness that you have been having for the past 3 months.  Call his office to get an appointment.

## 2019-12-24 NOTE — ED Notes (Signed)
When Pt transitioned to Standing position from Sitting during Orthostatic V/S, Pts Gait became unsteady but needed minimal assistance from RN to remain Standing to complete the test.

## 2020-01-07 ENCOUNTER — Encounter (INDEPENDENT_AMBULATORY_CARE_PROVIDER_SITE_OTHER): Payer: Self-pay | Admitting: Otolaryngology

## 2020-01-07 ENCOUNTER — Other Ambulatory Visit: Payer: Self-pay

## 2020-01-07 ENCOUNTER — Ambulatory Visit (INDEPENDENT_AMBULATORY_CARE_PROVIDER_SITE_OTHER): Payer: Managed Care, Other (non HMO) | Admitting: Otolaryngology

## 2020-01-07 VITALS — Temp 97.3°F

## 2020-01-07 DIAGNOSIS — R42 Dizziness and giddiness: Secondary | ICD-10-CM | POA: Diagnosis not present

## 2020-01-07 NOTE — Progress Notes (Signed)
HPI: Fred Boyd is a 44 y.o. male who presents for evaluation of dizziness.  Patient works as a Naval architect and initially noticed dizziness about a year and a half ago when he was looking out his truck sideways and felt imbalanced and dizzy.  On December 24, 2019 patient apparently went to the bathroom in the middle of the night and passed out hitting his head and was seen in the ED where he had a CT scan that was normal with no middle ear or inner ear abnormality noted and no sinus abnormality noted and no intracranial abnormality noted. Apparently he has previously also had an MRI scan of his head over a year ago that was negative for any intracranial abnormalities. Sometimes when he rolls over in bed especially to the right side he will have short episode of spinning or dizziness. He has not noted any problems with his hearing.  Past Medical History:  Diagnosis Date  . Diabetes mellitus without complication (HCC)    Borderline; No longer diabetic  . GERD (gastroesophageal reflux disease)   . Pancreatitis    Past Surgical History:  Procedure Laterality Date  . FRACTURE SURGERY    . HAND SURGERY     Social History   Socioeconomic History  . Marital status: Married    Spouse name: Not on file  . Number of children: Not on file  . Years of education: Not on file  . Highest education level: Not on file  Occupational History  . Not on file  Tobacco Use  . Smoking status: Current Some Day Smoker    Packs/day: 0.50    Years: 20.00    Pack years: 10.00    Types: Cigarettes    Start date: 2  . Smokeless tobacco: Never Used  . Tobacco comment: has stopped and started back  Vaping Use  . Vaping Use: Never used  Substance and Sexual Activity  . Alcohol use: Yes    Comment: weekends   . Drug use: No  . Sexual activity: Yes  Other Topics Concern  . Not on file  Social History Narrative  . Not on file   Social Determinants of Health   Financial Resource Strain:   .  Difficulty of Paying Living Expenses:   Food Insecurity:   . Worried About Programme researcher, broadcasting/film/video in the Last Year:   . Barista in the Last Year:   Transportation Needs:   . Freight forwarder (Medical):   Marland Kitchen Lack of Transportation (Non-Medical):   Physical Activity:   . Days of Exercise per Week:   . Minutes of Exercise per Session:   Stress:   . Feeling of Stress :   Social Connections:   . Frequency of Communication with Friends and Family:   . Frequency of Social Gatherings with Friends and Family:   . Attends Religious Services:   . Active Member of Clubs or Organizations:   . Attends Banker Meetings:   Marland Kitchen Marital Status:    Family History  Problem Relation Age of Onset  . Diabetes Mother   . Hypertension Mother   . Diabetes Father   . Hypertension Father   . Gastric cancer Cousin 37  . Colon cancer Neg Hx   . Esophageal cancer Neg Hx    Allergies  Allergen Reactions  . Penicillins Itching    Has patient had a PCN reaction causing immediate rash, facial/tongue/throat swelling, SOB or lightheadedness with hypotension: No Has patient  had a PCN reaction causing severe rash involving mucus membranes or skin necrosis: No Has patient had a PCN reaction that required hospitalization: No Has patient had a PCN reaction occurring within the last 10 years: No If all of the above answers are "NO", then may proceed with Cephalosporin use.    Prior to Admission medications   Medication Sig Start Date End Date Taking? Authorizing Provider  escitalopram (LEXAPRO) 10 MG tablet Take 10 mg by mouth daily as needed (for mood/depression).    Yes [provider]  HYDROcodone-acetaminophen (NORCO) 5-325 MG tablet Take 1-2 tablets by mouth every 6 (six) hours as needed. 12/28/18  Yes Delo, Riley Lam, MD  ibuprofen (ADVIL) 800 MG tablet Take 1 tablet (800 mg total) by mouth 3 (three) times daily. 11/26/19  Yes Avegno, Zachery Dakins, FNP  omeprazole (PRILOSEC) 20 MG  capsule TAKE ONE CAPSULE BY MOUTH TWICE A DAY BEFORE MEALS 11/29/18  Yes Hawks, Christy A, FNP  pantoprazole (PROTONIX) 20 MG tablet Take 1 tablet (20 mg total) by mouth 2 (two) times daily. 04/08/19  Yes Eber Hong, MD  sertraline (ZOLOFT) 25 MG tablet Take 25 mg by mouth daily.   Yes [provider]  sucralfate (CARAFATE) 1 g tablet Take 1 tablet (1 g total) by mouth 4 (four) times daily -  with meals and at bedtime. 04/08/19  Yes Eber Hong, MD     Positive ROS: Otherwise negative  All other systems have been reviewed and were otherwise negative with the exception of those mentioned in the HPI and as above.  Physical Exam: Constitutional: Alert, well-appearing, no acute distress Ears: External ears without lesions or tenderness. Ear canals are clear bilaterally.  On microscopic exam both TMs are clear with good mobility pneumatic otoscopy.  On Dix-Hallpike testing in the office he had no obvious nystagmus although he felt slightly imbalance when turning over and sitting up. Nasal: External nose without lesions.. Clear nasal passages Oral: Lips and gums without lesions. Tongue and palate mucosa without lesions. Posterior oropharynx clear. Neck: No palpable adenopathy or masses Respiratory: Breathing comfortably  Skin: No facial/neck lesions or rash noted.  Procedures  Assessment: Chronic dizziness. Questionable BPPV.  Plan: Discussed with patient as well as his wife concerning further evaluation of dizziness and would recommend VNG testing and audiologic testing.  I would also recommend further evaluation with neurologist. They both got frustrated after discussing this with them as I cannot give them a definite diagnosis on my findings on clinical exam in the office today. Would recommend further evaluation with neurology and further evaluation for BPPV with VNG testing and audiologic testing if they would agree to schedule this. I gave him information and briefly  discussed BPPV with them.  Narda Bonds, MD

## 2020-05-10 ENCOUNTER — Other Ambulatory Visit: Payer: Self-pay

## 2020-05-10 ENCOUNTER — Emergency Department (HOSPITAL_COMMUNITY)
Admission: EM | Admit: 2020-05-10 | Discharge: 2020-05-10 | Disposition: A | Discharge status: Home / Self Care | Payer: Managed Care, Other (non HMO) | Attending: Emergency Medicine | Admitting: Emergency Medicine

## 2020-05-10 ENCOUNTER — Encounter (HOSPITAL_COMMUNITY): Payer: Self-pay

## 2020-05-10 DIAGNOSIS — E119 Type 2 diabetes mellitus without complications: Secondary | ICD-10-CM | POA: Insufficient documentation

## 2020-05-10 DIAGNOSIS — F17210 Nicotine dependence, cigarettes, uncomplicated: Secondary | ICD-10-CM | POA: Insufficient documentation

## 2020-05-10 DIAGNOSIS — T781XXA Other adverse food reactions, not elsewhere classified, initial encounter: Secondary | ICD-10-CM | POA: Insufficient documentation

## 2020-05-10 MED ORDER — LIDOCAINE VISCOUS HCL 2 % MT SOLN
15.0000 mL | Freq: Once | OROMUCOSAL | Status: AC
  Administered 2020-05-10: 15 mL via OROMUCOSAL
  Filled 2020-05-10: qty 15

## 2020-05-10 MED ORDER — PREDNISONE 20 MG PO TABS: 40.0000 mg | ORAL_TABLET | Freq: Every day | ORAL | 0 refills | Status: AC

## 2020-05-10 MED ORDER — METHYLPREDNISOLONE SODIUM SUCC 125 MG IJ SOLR
125.0000 mg | Freq: Once | INTRAMUSCULAR | Status: AC
  Administered 2020-05-10: 125 mg via INTRAVENOUS
  Filled 2020-05-10: qty 2

## 2020-05-10 MED ORDER — MAGIC MOUTHWASH W/LIDOCAINE: 5.0000 mL | Freq: Three times a day (TID) | ORAL | 0 refills | Status: AC | PRN

## 2020-05-10 MED ORDER — DIPHENHYDRAMINE HCL 50 MG/ML IJ SOLN
25.0000 mg | Freq: Once | INTRAMUSCULAR | Status: AC
  Administered 2020-05-10: 25 mg via INTRAVENOUS
  Filled 2020-05-10: qty 1

## 2020-05-10 NOTE — Discharge Instructions (Addendum)
You may continue to take Benadryl 1 capsule every 6 hours if needed.  Soft foods and liquids only for the first 1 to 2 days.  Try to avoid milk products and foods that contain citric acids.  You may need to monitor your blood sugars closely while taking prednisone as this can temporarily elevate your blood sugar.  Stop the prednisone if your blood sugar becomes elevated.  Follow-up with your primary doctor for recheck.  Return the emergency department for any worsening symptoms.

## 2020-05-10 NOTE — ED Provider Notes (Signed)
[facility] EMERGENCY DEPARTMENT Provider Note   CSN: [id number] Arrival date & time: 05/10/20  0831     History Chief Complaint  Patient presents with   Sore Throat    Fred Boyd is a 44 y.o. male.  HPI     Fred Boyd is a 44 y.o. male who presents to the Emergency Department complaining of sore throat pain and foreign body sensation of the throat.  Symptoms began around 1 AM this morning after drinking an immune booster energy drink.  States that the drink contain ginger and turmeric.  He described an immediate burning sensation of his throat after swallowing it.  This morning, he states that he feels that something is stuck in his throat and it burns when he swallows his saliva.  He denies facial swelling, swelling of his lips or tongue, chest pain or shortness of breath.  He rinsed his throat with warm water without relief.    Past Medical History:  Diagnosis Date   Diabetes mellitus without complication (HCC)    Borderline; No longer diabetic   GERD (gastroesophageal reflux disease)    Pancreatitis     Patient Active Problem List   Diagnosis Date Noted   GERD (gastroesophageal reflux disease) 06/23/2017   Abdominal pain, chronic, epigastric 06/23/2017   Abnormal CT of the abdomen 06/23/2017    Past Surgical History:  Procedure Laterality Date   FRACTURE SURGERY     HAND SURGERY         Family History  Problem Relation Age of Onset   Diabetes Mother    Hypertension Mother    Diabetes Father    Hypertension Father    Gastric cancer Cousin 66   Colon cancer Neg Hx    Esophageal cancer Neg Hx     Social History   Tobacco Use   Smoking status: Current Some Day Smoker    Packs/day: 0.50    Years: 20.00    Pack years: 10.00    Types: Cigarettes    Start date: 1997   Smokeless tobacco: Never Used   Tobacco comment: has stopped and started back  Vaping Use   Vaping Use: Never used  Substance Use Topics   Alcohol use: Yes      Comment: weekends    Drug use: No    Home Medications Prior to Admission medications   Medication Sig Start Date End Date Taking? Authorizing Provider  escitalopram (LEXAPRO) 10 MG tablet Take 10 mg by mouth daily as needed (for mood/depression).     [provider]  HYDROcodone-acetaminophen (NORCO) 5-325 MG tablet Take 1-2 tablets by mouth every 6 (six) hours as needed. 12/28/18   Geoffery Lyons, MD  ibuprofen (ADVIL) 800 MG tablet Take 1 tablet (800 mg total) by mouth 3 (three) times daily. 11/26/19   Avegno, Zachery Dakins, FNP  omeprazole (PRILOSEC) 20 MG capsule TAKE ONE CAPSULE BY MOUTH TWICE A DAY BEFORE MEALS 11/29/18   Hawks, Neysa Bonito A, FNP  pantoprazole (PROTONIX) 20 MG tablet Take 1 tablet (20 mg total) by mouth 2 (two) times daily. 04/08/19   Eber Hong, MD  sertraline (ZOLOFT) 25 MG tablet Take 25 mg by mouth daily.    [provider]  sucralfate (CARAFATE) 1 g tablet Take 1 tablet (1 g total) by mouth 4 (four) times daily -  with meals and at bedtime. 04/08/19   Eber Hong, MD    Allergies    Penicillins  Review of Systems   Review of Systems  Constitutional: Negative for chills, fatigue and fever.  HENT: Positive for sore throat and trouble swallowing. Negative for congestion and voice change.   Respiratory: Negative for cough, chest tightness, shortness of breath and wheezing.   Cardiovascular: Negative for chest pain.  Gastrointestinal: Negative for abdominal pain, nausea and vomiting.  Musculoskeletal: Negative for neck pain and neck stiffness.  Skin: Negative for color change and rash.  Neurological: Negative for dizziness, weakness, numbness and headaches.  Hematological: Does not bruise/bleed easily.    Physical Exam Updated Vital Signs BP 123/87 (BP Location: Left Arm)    Pulse 62    Temp (!) 97.5 F (36.4 C) (Oral)    Resp 18    Ht 6' (1.829 m)    Wt 90.7 kg    SpO2 99%    BMI 27.12 kg/m   Physical Exam Vitals and nursing note  reviewed.  Constitutional:      General: He is not in acute distress.    Appearance: Normal appearance. He is well-developed.  HENT:     Head: Normocephalic.     Mouth/Throat:     Mouth: Mucous membranes are moist. No oral lesions or angioedema.     Tongue: No lesions.     Palate: No lesions.     Pharynx: Uvula midline. Uvula swelling (mild edema of the uvula.  remains midline.  ) present. No posterior oropharyngeal erythema.  Eyes:     Pupils: Pupils are equal, round, and reactive to light.  Neck:     Thyroid: No thyromegaly.     Meningeal: Kernig's sign absent.  Cardiovascular:     Rate and Rhythm: Normal rate and regular rhythm.     Pulses: Normal pulses.  Pulmonary:     Effort: Pulmonary effort is normal. No respiratory distress.     Breath sounds: Normal breath sounds. No stridor. No wheezing.  Abdominal:     Palpations: Abdomen is soft.     Tenderness: There is no abdominal tenderness. There is no guarding or rebound.  Musculoskeletal:        General: Normal range of motion.     Cervical back: Normal range of motion.  Lymphadenopathy:     Cervical: No cervical adenopathy.  Skin:    General: Skin is warm.     Capillary Refill: Capillary refill takes less than 2 seconds.     Findings: No rash.  Neurological:     General: No focal deficit present.     Mental Status: He is alert.     Sensory: No sensory deficit.     Motor: No weakness.     ED Results / Procedures / Treatments   Labs (all labs ordered are listed, but only abnormal results are displayed) Labs Reviewed - No data to display  EKG None  Radiology No results found.  Procedures Procedures (including critical care time)  Medications Ordered in ED Medications - No data to display  ED Course  I have reviewed the triage vital signs and the nursing notes.  Pertinent labs & imaging results that were available during my care of the patient were reviewed by me and considered in my medical decision  making (see chart for details).    MDM Rules/Calculators/A&P                          Pt here with sore throat and FB sensation with swallowing.  Sx's began after drinking an immune booster energy drink with ginger and turmeric.  On exam, he has mild edema of the uvula.  No edema of the lips, tongue or face.  Handles his secretions well.  Vitals reviewed.  No sx's suggestive of anaphylaxis or angioedema. No respiratory distress or stridor.   1100  On recheck, pt resting comfortably.  Airway remains patent.  He has been observed in the dept w/o complications.  No progressing edema.  No stridor, wheezing or dyspnea.  Continues to handle secretions well.    I feel he is appropriate for d/c home, family's questions answered.  Given strict return precautions.      Final Clinical Impression(s) / ED Diagnoses Final diagnoses:  Allergic reaction to food, initial encounter    Rx / DC Orders ED Discharge Orders    None       Pauline Aus, PA-C 05/10/20 1216    Derwood Kaplan, MD 05/11/20 [phone]

## 2020-05-10 NOTE — ED Triage Notes (Signed)
Pt presents to ED with complaints of swollen throat after taking immune booster last night. Pt states after he took it his throat feels burnt and swollen.

## 2020-08-31 ENCOUNTER — Other Ambulatory Visit: Payer: Self-pay

## 2020-08-31 ENCOUNTER — Emergency Department (HOSPITAL_COMMUNITY)
Admission: EM | Admit: 2020-08-31 | Discharge: 2020-08-31 | Disposition: A | Discharge status: Home / Self Care | Payer: Self-pay | Attending: Emergency Medicine | Admitting: Emergency Medicine

## 2020-08-31 ENCOUNTER — Encounter (HOSPITAL_COMMUNITY): Payer: Self-pay

## 2020-08-31 ENCOUNTER — Emergency Department (HOSPITAL_COMMUNITY): Payer: Self-pay

## 2020-08-31 DIAGNOSIS — K219 Gastro-esophageal reflux disease without esophagitis: Secondary | ICD-10-CM | POA: Insufficient documentation

## 2020-08-31 DIAGNOSIS — R030 Elevated blood-pressure reading, without diagnosis of hypertension: Secondary | ICD-10-CM

## 2020-08-31 DIAGNOSIS — F17210 Nicotine dependence, cigarettes, uncomplicated: Secondary | ICD-10-CM | POA: Insufficient documentation

## 2020-08-31 DIAGNOSIS — E119 Type 2 diabetes mellitus without complications: Secondary | ICD-10-CM | POA: Insufficient documentation

## 2020-08-31 DIAGNOSIS — R112 Nausea with vomiting, unspecified: Secondary | ICD-10-CM | POA: Insufficient documentation

## 2020-08-31 DIAGNOSIS — R109 Unspecified abdominal pain: Secondary | ICD-10-CM | POA: Insufficient documentation

## 2020-08-31 DIAGNOSIS — S39011A Strain of muscle, fascia and tendon of abdomen, initial encounter: Secondary | ICD-10-CM

## 2020-08-31 LAB — RAPID URINE DRUG SCREEN, HOSP PERFORMED
Amphetamines: NOT DETECTED
Barbiturates: NOT DETECTED
Benzodiazepines: NOT DETECTED
Cocaine: NOT DETECTED
Opiates: NOT DETECTED
Tetrahydrocannabinol: POSITIVE — AB

## 2020-08-31 LAB — CBC
HCT: 48.1 % (ref 39.0–52.0)
Hemoglobin: 15.1 g/dL (ref 13.0–17.0)
MCH: 26.9 pg (ref 26.0–34.0)
MCHC: 31.4 g/dL (ref 30.0–36.0)
MCV: 85.7 fL (ref 80.0–100.0)
Platelets: 230 10*3/uL (ref 150–400)
RBC: 5.61 MIL/uL (ref 4.22–5.81)
RDW: 13.1 % (ref 11.5–15.5)
WBC: 7.2 10*3/uL (ref 4.0–10.5)
nRBC: 0 % (ref 0.0–0.2)

## 2020-08-31 LAB — URINALYSIS, ROUTINE W REFLEX MICROSCOPIC
Bacteria, UA: NONE SEEN
Bilirubin Urine: NEGATIVE
Glucose, UA: NEGATIVE mg/dL
Ketones, ur: NEGATIVE mg/dL
Leukocytes,Ua: NEGATIVE
Nitrite: NEGATIVE
Protein, ur: NEGATIVE mg/dL
Specific Gravity, Urine: 1.017 (ref 1.005–1.030)
pH: 6 (ref 5.0–8.0)

## 2020-08-31 LAB — COMPREHENSIVE METABOLIC PANEL
ALT: 46 U/L — ABNORMAL HIGH (ref 0–44)
AST: 33 U/L (ref 15–41)
Albumin: 4.5 g/dL (ref 3.5–5.0)
Alkaline Phosphatase: 56 U/L (ref 38–126)
Anion gap: 8 (ref 5–15)
BUN: 9 mg/dL (ref 6–20)
CO2: 26 mmol/L (ref 22–32)
Calcium: 9.2 mg/dL (ref 8.9–10.3)
Chloride: 103 mmol/L (ref 98–111)
Creatinine, Ser: 1.18 mg/dL (ref 0.61–1.24)
GFR, Estimated: >60 mL/min (ref >60)
Glucose, Bld: 133 mg/dL — ABNORMAL HIGH (ref 70–99)
Potassium: 4 mmol/L (ref 3.5–5.1)
Sodium: 137 mmol/L (ref 135–145)
Total Bilirubin: 0.9 mg/dL (ref 0.3–1.2)
Total Protein: 8.2 g/dL — ABNORMAL HIGH (ref 6.5–8.1)

## 2020-08-31 LAB — LIPASE, BLOOD: Lipase: 29 U/L (ref 11–51)

## 2020-08-31 MED ORDER — LORAZEPAM 2 MG/ML IJ SOLN
1.0000 mg | Freq: Once | INTRAMUSCULAR | Status: AC
  Administered 2020-08-31: 1 mg via INTRAVENOUS
  Filled 2020-08-31: qty 1

## 2020-08-31 MED ORDER — KETOROLAC TROMETHAMINE 15 MG/ML IJ SOLN
15.0000 mg | Freq: Once | INTRAMUSCULAR | Status: AC
  Administered 2020-08-31: 15 mg via INTRAVENOUS
  Filled 2020-08-31: qty 1

## 2020-08-31 MED ORDER — ONDANSETRON HCL 4 MG/2ML IJ SOLN
4.0000 mg | Freq: Once | INTRAMUSCULAR | Status: AC
  Administered 2020-08-31: 4 mg via INTRAVENOUS
  Filled 2020-08-31: qty 2

## 2020-08-31 MED ORDER — METHOCARBAMOL 500 MG PO TABS
750.0000 mg | ORAL_TABLET | Freq: Once | ORAL | Status: AC
  Administered 2020-08-31: 750 mg via ORAL
  Filled 2020-08-31: qty 2

## 2020-08-31 MED ORDER — HYDROMORPHONE HCL 1 MG/ML IJ SOLN
1.0000 mg | Freq: Once | INTRAMUSCULAR | Status: AC
  Administered 2020-08-31: 1 mg via INTRAVENOUS
  Filled 2020-08-31: qty 1

## 2020-08-31 MED ORDER — METHOCARBAMOL 750 MG PO TABS: 750.0000 mg | ORAL_TABLET | Freq: Three times a day (TID) | ORAL | 0 refills | Status: AC | PRN

## 2020-08-31 MED ORDER — SODIUM CHLORIDE 0.9 % IV BOLUS
1000.0000 mL | Freq: Once | INTRAVENOUS | Status: AC
  Administered 2020-08-31: 1000 mL via INTRAVENOUS

## 2020-08-31 NOTE — ED Notes (Signed)
Pt back from CT

## 2020-08-31 NOTE — ED Notes (Signed)
Patient transported to CT 

## 2020-08-31 NOTE — ED Triage Notes (Signed)
Pt presents to ED with complaints of right sided flank pain, weakness and vomiting x 2 hours.

## 2020-08-31 NOTE — Discharge Instructions (Addendum)
It was our pleasure to provide your ER care today - we hope that you feel better.  Take acetaminophen or ibuprofen as need for pain. You may also take robaxin as need for muscle pain/spasm - no driving for the next 6 hours, or if/when taking robaxin.   Follow up with primary care doctor in the coming week - also have your blood pressure rechecked then as it is high today.  Return to ER if worse, new symptoms, fevers, severe or intractable pain, persistent vomiting, or other concern.

## 2020-08-31 NOTE — ED Notes (Signed)
pts wife states she does not understand her husbands diagnosis and would like a better understanding of such. Pt and wife requesting to see MD before d/c home. MD made aware.

## 2020-08-31 NOTE — ED Notes (Signed)
Patient aware of urine sample needed.  

## 2020-08-31 NOTE — ED Provider Notes (Signed)
[facility] EMERGENCY DEPARTMENT Provider Note   CSN: [id number] Arrival date & time: 08/31/20  1941     History Chief Complaint  Patient presents with  . Flank Pain    Fred Boyd is a 45 y.o. male.  Patient c/o acute onset right upper abd/flank pain this AM, at rest. Symptoms acute onset, mod-severe, constant, dull, non radiating. Denies hx same pain. No fever or chills. No dysuria or hematuria. +nausea. Vomiting x 2, not bloody or bilious. Having normal bms. No constipation or abd distension. No known bad food ingestion or ill contacts. No chest pain. No cough or sob.   The history is provided by the patient.  Flank Pain Pertinent negatives include no chest pain, no abdominal pain, no headaches and no shortness of breath.       Past Medical History:  Diagnosis Date  . Diabetes mellitus without complication (HCC)    Borderline; No longer diabetic  . GERD (gastroesophageal reflux disease)   . Pancreatitis     Patient Active Problem List   Diagnosis Date Noted  . GERD (gastroesophageal reflux disease) 06/23/2017  . Abdominal pain, chronic, epigastric 06/23/2017  . Abnormal CT of the abdomen 06/23/2017    Past Surgical History:  Procedure Laterality Date  . FRACTURE SURGERY    . HAND SURGERY         Family History  Problem Relation Age of Onset  . Diabetes Mother   . Hypertension Mother   . Diabetes Father   . Hypertension Father   . Gastric cancer Cousin 37  . Colon cancer Neg Hx   . Esophageal cancer Neg Hx     Social History   Tobacco Use  . Smoking status: Current Some Day Smoker    Packs/day: 0.50    Years: 20.00    Pack years: 10.00    Types: Cigarettes    Start date: 63  . Smokeless tobacco: Never Used  . Tobacco comment: has stopped and started back  Vaping Use  . Vaping Use: Never used  Substance Use Topics  . Alcohol use: Yes    Comment: weekends   . Drug use: No    Home Medications Prior to Admission medications    Medication Sig Start Date End Date Taking? Authorizing Provider  escitalopram (LEXAPRO) 10 MG tablet Take 10 mg by mouth daily as needed (for mood/depression).    Yes [provider]  ibuprofen (ADVIL) 800 MG tablet Take 1 tablet (800 mg total) by mouth 3 (three) times daily. Patient taking differently: Take 800 mg by mouth every 8 (eight) hours as needed. 11/26/19  Yes Avegno, Zachery Dakins, FNP  pantoprazole (PROTONIX) 20 MG tablet Take 1 tablet (20 mg total) by mouth 2 (two) times daily. 04/08/19  Yes Eber Hong, MD  sertraline (ZOLOFT) 25 MG tablet Take 25 mg by mouth daily.   Yes [provider]  traMADol (ULTRAM) 50 MG tablet Take 50 mg by mouth every 6 (six) hours as needed for moderate pain.   Yes [provider]  magic mouthwash w/lidocaine SOLN Take 5 mLs by mouth 3 (three) times daily as needed for mouth pain. Swish and spit, do not swallow Patient not taking: Reported on 08/31/2020 05/10/20   Triplett, Tammy, PA-C  omeprazole (PRILOSEC) 20 MG capsule TAKE ONE CAPSULE BY MOUTH TWICE A DAY BEFORE MEALS Patient not taking: Reported on 08/31/2020 11/29/18   Jannifer Rodney A, FNP  predniSONE (DELTASONE) 20 MG tablet Take 2 tablets (40 mg total) by  mouth daily. Patient not taking: Reported on 08/31/2020 05/10/20   Triplett, Tammy, PA-C  sucralfate (CARAFATE) 1 g tablet Take 1 tablet (1 g total) by mouth 4 (four) times daily -  with meals and at bedtime. Patient not taking: Reported on 08/31/2020 04/08/19   Eber Hong, MD    Allergies    Penicillins  Review of Systems   Review of Systems  Constitutional: Negative for fever.  HENT: Negative for sore throat.   Eyes: Negative for redness.  Respiratory: Negative for cough and shortness of breath.   Cardiovascular: Negative for chest pain and leg swelling.  Gastrointestinal: Positive for nausea and vomiting. Negative for abdominal pain.  Genitourinary: Positive for flank pain.  Musculoskeletal: Negative for back  pain and neck pain.  Skin: Negative for rash.  Neurological: Negative for headaches.  Hematological: Does not bruise/bleed easily.  Psychiatric/Behavioral: Negative for confusion.    Physical Exam Updated Vital Signs BP (!) 153/108 (BP Location: Right Arm)   Pulse 69   Temp 97.9 F (36.6 C) (Oral)   Resp 20   Ht 1.829 m (6')   Wt 93 kg   SpO2 99%   BMI 27.80 kg/m   Physical Exam Vitals and nursing note reviewed.  Constitutional:      Appearance: Normal appearance. He is well-developed.  HENT:     Head: Atraumatic.     Nose: Nose normal.     Mouth/Throat:     Mouth: Mucous membranes are moist.     Pharynx: Oropharynx is clear.  Eyes:     General: No scleral icterus.    Conjunctiva/sclera: Conjunctivae normal.  Neck:     Trachea: No tracheal deviation.  Cardiovascular:     Rate and Rhythm: Normal rate and regular rhythm.     Pulses: Normal pulses.     Heart sounds: Normal heart sounds. No murmur heard. No friction rub. No gallop.   Pulmonary:     Effort: Pulmonary effort is normal. No accessory muscle usage or respiratory distress.     Breath sounds: Normal breath sounds.  Abdominal:     General: Bowel sounds are normal. There is no distension.     Palpations: Abdomen is soft.     Tenderness: There is abdominal tenderness. There is no guarding.     Comments: Right upper abd tenderness.   Genitourinary:    Comments: No cva tenderness. Musculoskeletal:        General: No swelling or tenderness.     Cervical back: Normal range of motion and neck supple. No rigidity.     Right lower leg: No edema.     Left lower leg: No edema.  Skin:    General: Skin is warm and dry.     Findings: No rash.  Neurological:     Mental Status: He is alert.     Comments: Alert, speech clear.   Psychiatric:     Comments: Anxious appearing.      ED Results / Procedures / Treatments   Labs (all labs ordered are listed, but only abnormal results are displayed) Results for orders  placed or performed during the hospital encounter of 08/31/20  CBC  Result Value Ref Range   WBC 7.2 4.0 - 10.5 K/uL   RBC 5.61 4.22 - 5.81 MIL/uL   Hemoglobin 15.1 13.0 - 17.0 g/dL   HCT 81.8 29.9 - 37.1 %   MCV 85.7 80.0 - 100.0 fL   MCH 26.9 26.0 - 34.0 pg   MCHC 31.4 30.0 -  36.0 g/dL   RDW 16.113.1 09.611.5 - 04.515.5 %   Platelets 230 150 - 400 K/uL   nRBC 0.0 0.0 - 0.2 %  Comprehensive metabolic panel  Result Value Ref Range   Sodium 137 135 - 145 mmol/L   Potassium 4.0 3.5 - 5.1 mmol/L   Chloride 103 98 - 111 mmol/L   CO2 26 22 - 32 mmol/L   Glucose, Bld 133 (H) 70 - 99 mg/dL   BUN 9 6 - 20 mg/dL   Creatinine, Ser 4.091.18 0.61 - 1.24 mg/dL   Calcium 9.2 8.9 - 81.110.3 mg/dL   Total Protein 8.2 (H) 6.5 - 8.1 g/dL   Albumin 4.5 3.5 - 5.0 g/dL   AST 33 15 - 41 U/L   ALT 46 (H) 0 - 44 U/L   Alkaline Phosphatase 56 38 - 126 U/L   Total Bilirubin 0.9 0.3 - 1.2 mg/dL   GFR, Estimated >91>60 >47>60 mL/min   Anion gap 8 5 - 15  Lipase, blood  Result Value Ref Range   Lipase 29 11 - 51 U/L  Urinalysis, Routine w reflex microscopic Urine, Random  Result Value Ref Range   Color, Urine YELLOW YELLOW   APPearance CLEAR CLEAR   Specific Gravity, Urine 1.017 1.005 - 1.030   pH 6.0 5.0 - 8.0   Glucose, UA NEGATIVE NEGATIVE mg/dL   Hgb urine dipstick SMALL (A) NEGATIVE   Bilirubin Urine NEGATIVE NEGATIVE   Ketones, ur NEGATIVE NEGATIVE mg/dL   Protein, ur NEGATIVE NEGATIVE mg/dL   Nitrite NEGATIVE NEGATIVE   Leukocytes,Ua NEGATIVE NEGATIVE   RBC / HPF 0-5 0 - 5 RBC/hpf   WBC, UA 0-5 0 - 5 WBC/hpf   Bacteria, UA NONE SEEN NONE SEEN   Mucus PRESENT   Rapid urine drug screen (hospital performed)  Result Value Ref Range   Opiates NONE DETECTED NONE DETECTED   Cocaine NONE DETECTED NONE DETECTED   Benzodiazepines NONE DETECTED NONE DETECTED   Amphetamines NONE DETECTED NONE DETECTED   Tetrahydrocannabinol POSITIVE (A) NONE DETECTED   Barbiturates NONE DETECTED NONE DETECTED     EKG None  Radiology CT RENAL STONE STUDY  Result Date: 08/31/2020 CLINICAL DATA:  Right flank pain this morning. EXAM: CT ABDOMEN AND PELVIS WITHOUT CONTRAST TECHNIQUE: Multidetector CT imaging of the abdomen and pelvis was performed following the standard protocol without IV contrast. COMPARISON:  June 02, 2018 FINDINGS: Lower chest: No acute abnormality. Hepatobiliary: Stable subcentimeter low-density lesions are identified in the liver likely liver cyst. Gallbladder is normal. The biliary tree is normal. Pancreas: Unremarkable. No pancreatic ductal dilatation or surrounding inflammatory changes. Spleen: Normal in size without focal abnormality. Adrenals/Urinary Tract: The bilateral. The right kidney is normal. There is a 2 mm nonobstructing stone in the lower pole left kidney. There is no hydronephrosis bilaterally. The bladder is normal. Stomach/Bowel: Small hiatal hernia. Stomach is otherwise within normal limits. Appendix appears normal. No evidence of bowel wall thickening, distention, or inflammatory changes. Vascular/Lymphatic: No significant vascular findings are present. No enlarged abdominal or pelvic lymph nodes. Reproductive: Prostate calcifications are identified. Other: Small bilateral inguinal herniation of mesenteric fat is noted. Pelvic phleboliths are identified. There is no ascites. Musculoskeletal: No acute or significant osseous findings. IMPRESSION: 1. No acute abnormality identified in the abdomen and pelvis. 2. 2 mm nonobstructing stone in the lower pole left kidney. No hydronephrosis bilaterally. Electronically Signed   By: Sherian ReinWei-Chen  Lin M.D.   On: 08/31/2020 09:06    Procedures Procedures   Medications  Ordered in ED Medications  HYDROmorphone (DILAUDID) injection 1 mg (1 mg Intravenous Given 08/31/20 0842)  ondansetron (ZOFRAN) injection 4 mg (4 mg Intravenous Given 08/31/20 0841)    ED Course  I have reviewed the triage vital signs and the nursing  notes.  Pertinent labs & imaging results that were available during my care of the patient were reviewed by me and considered in my medical decision making (see chart for details).    MDM Rules/Calculators/A&P                         Iv ns. Dilaudid 1 mg iv. zofran iv. Labs sent. Imaging ordered.   Reviewed nursing notes and prior charts for additional history. Pt is noted to have previous ct imaging studies for c/o acute pain/appearing in distress - those prior ct/dissetion studies neg for acute process.   Labs reviewed/interpreted by me - chem normal.   CT reviewed/interpreted by me - neg acute.   Recheck pt more comfortable. Po fluids.   Recheck abd soft non tender, pt does appear to have more of a musculoskeletal/right lateral abd wall pain or strain, possibly from earlier nv and dry heaves.  toradol 15 mg iv. Robaxin iv.   No recurrent vomiting. Tolerating po. Pain improved. Patient currently appears stable for d/c.   Rec pcp f/u.  Return precautions provided.        Final Clinical Impression(s) / ED Diagnoses Final diagnoses:  None    Rx / DC Orders ED Discharge Orders    None       Cathren Laine, MD 08/31/20 1155

## 2021-03-09 ENCOUNTER — Emergency Department (HOSPITAL_COMMUNITY)
Admission: EM | Admit: 2021-03-09 | Discharge: 2021-03-09 | Disposition: A | Discharge status: Home / Self Care | Payer: 59 | Attending: Emergency Medicine | Admitting: Emergency Medicine

## 2021-03-09 ENCOUNTER — Encounter (HOSPITAL_COMMUNITY): Payer: Self-pay | Admitting: Emergency Medicine

## 2021-03-09 ENCOUNTER — Other Ambulatory Visit: Payer: Self-pay

## 2021-03-09 DIAGNOSIS — F17210 Nicotine dependence, cigarettes, uncomplicated: Secondary | ICD-10-CM | POA: Insufficient documentation

## 2021-03-09 DIAGNOSIS — H5789 Other specified disorders of eye and adnexa: Secondary | ICD-10-CM

## 2021-03-09 DIAGNOSIS — E119 Type 2 diabetes mellitus without complications: Secondary | ICD-10-CM | POA: Insufficient documentation

## 2021-03-09 DIAGNOSIS — H05221 Edema of right orbit: Secondary | ICD-10-CM | POA: Diagnosis not present

## 2021-03-09 DIAGNOSIS — R609 Edema, unspecified: Secondary | ICD-10-CM | POA: Diagnosis present

## 2021-03-09 MED ORDER — LORATADINE 10 MG PO TABS: 10.0000 mg | ORAL_TABLET | Freq: Every day | ORAL | 0 refills | Status: AC

## 2021-03-09 MED ORDER — DOXYCYCLINE HYCLATE 100 MG PO CAPS: 100.0000 mg | ORAL_CAPSULE | Freq: Two times a day (BID) | ORAL | 0 refills | Status: AC

## 2021-03-09 MED ORDER — TETRACAINE HCL 0.5 % OP SOLN
2.0000 [drp] | Freq: Once | OPHTHALMIC | Status: DC
  Filled 2021-03-09: qty 4

## 2021-03-09 MED ORDER — FAMOTIDINE 20 MG PO TABS: 20.0000 mg | ORAL_TABLET | Freq: Every day | ORAL | 0 refills | Status: AC

## 2021-03-09 MED ORDER — FLUORESCEIN SODIUM 1 MG OP STRP
1.0000 | ORAL_STRIP | Freq: Once | OPHTHALMIC | Status: DC
  Filled 2021-03-09: qty 1

## 2021-03-09 NOTE — ED Provider Notes (Signed)
[facility] EMERGENCY DEPARTMENT Provider Note   CSN: [id number] Arrival date & time: 03/09/21  0354     History Chief Complaint  Patient presents with   Eye Pain    Fred Boyd is a 45 y.o. male.  HPI  Patient with no significant medical history presents to the emergency department with chief complaint of right eye swelling.  Patient states he woke up yesterday morning with swelling beneath his right eye, states he saw a small bump underneath his eye states that it is painful when he presses around his eye, denies  visual changes, pain with eye movement, denies  trauma to the area.  He states he might of been bit by something, states that is looking better today but still has pain.  Patient admitts to slight drainage coming from his R eye, states is clear in nature, denies his eye feeling stuck shut  in the morning, he denies wearing contacts, does wear glasses.  Denies   rashes anywhere on his body, denies tongue, throat, lip swelling, difficulty breathing, denies new medication, new foods, changes detergent.  Has not taken anything yet for his eye pain.  Past Medical History:  Diagnosis Date   Diabetes mellitus without complication (HCC)    Borderline; No longer diabetic   GERD (gastroesophageal reflux disease)    Pancreatitis     Patient Active Problem List   Diagnosis Date Noted   GERD (gastroesophageal reflux disease) 06/23/2017   Abdominal pain, chronic, epigastric 06/23/2017   Abnormal CT of the abdomen 06/23/2017    Past Surgical History:  Procedure Laterality Date   FRACTURE SURGERY     HAND SURGERY         Family History  Problem Relation Age of Onset   Diabetes Mother    Hypertension Mother    Diabetes Father    Hypertension Father    Gastric cancer Cousin 65   Colon cancer Neg Hx    Esophageal cancer Neg Hx     Social History   Tobacco Use   Smoking status: Some Days    Packs/day: 0.50    Years: 20.00    Pack years: 10.00    Types:  Cigarettes    Start date: 1997   Smokeless tobacco: Never   Tobacco comments:    has stopped and started back  Vaping Use   Vaping Use: Never used  Substance Use Topics   Alcohol use: Yes    Comment: weekends    Drug use: No    Home Medications Prior to Admission medications   Medication Sig Start Date End Date Taking? Authorizing Provider  doxycycline (VIBRAMYCIN) 100 MG capsule Take 1 capsule (100 mg total) by mouth 2 (two) times daily for 7 days. 03/09/21 03/16/21 Yes Carroll Sage, PA-C  famotidine (PEPCID) 20 MG tablet Take 1 tablet (20 mg total) by mouth daily for 7 days. 03/09/21 03/16/21 Yes Carroll Sage, PA-C  loratadine (CLARITIN) 10 MG tablet Take 1 tablet (10 mg total) by mouth daily for 7 days. 03/09/21 03/16/21 Yes Carroll Sage, PA-C  escitalopram (LEXAPRO) 10 MG tablet Take 10 mg by mouth daily as needed (for mood/depression).     [provider]  ibuprofen (ADVIL) 800 MG tablet Take 1 tablet (800 mg total) by mouth 3 (three) times daily. Patient taking differently: Take 800 mg by mouth every 8 (eight) hours as needed. 11/26/19   Avegno, Zachery Dakins, FNP  magic mouthwash w/lidocaine SOLN Take 5 mLs by mouth 3 (  three) times daily as needed for mouth pain. Swish and spit, do not swallow Patient not taking: Reported on 08/31/2020 05/10/20   Triplett, Tammy, PA-C  methocarbamol (ROBAXIN) 750 MG tablet Take 1 tablet (750 mg total) by mouth 3 (three) times daily as needed (muscle spasm/pain). 08/31/20   Cathren Laine, MD  omeprazole (PRILOSEC) 20 MG capsule TAKE ONE CAPSULE BY MOUTH TWICE A DAY BEFORE MEALS Patient not taking: Reported on 08/31/2020 11/29/18   Jannifer Rodney A, FNP  pantoprazole (PROTONIX) 20 MG tablet Take 1 tablet (20 mg total) by mouth 2 (two) times daily. 04/08/19   Eber Hong, MD  predniSONE (DELTASONE) 20 MG tablet Take 2 tablets (40 mg total) by mouth daily. Patient not taking: Reported on 08/31/2020 05/10/20   Pauline Aus, PA-C   sertraline (ZOLOFT) 25 MG tablet Take 25 mg by mouth daily.    [provider]  sucralfate (CARAFATE) 1 g tablet Take 1 tablet (1 g total) by mouth 4 (four) times daily -  with meals and at bedtime. Patient not taking: Reported on 08/31/2020 04/08/19   Eber Hong, MD  traMADol (ULTRAM) 50 MG tablet Take 50 mg by mouth every 6 (six) hours as needed for moderate pain.    [provider]    Allergies    Penicillins  Review of Systems   Review of Systems  Constitutional:  Negative for chills and fever.  HENT:  Negative for congestion.   Eyes:  Positive for pain and discharge. Negative for photophobia, redness and visual disturbance.  Respiratory:  Negative for shortness of breath.   Cardiovascular:  Negative for chest pain.  Gastrointestinal:  Negative for abdominal pain.  Genitourinary:  Negative for enuresis.  Musculoskeletal:  Negative for back pain.  Skin:  Negative for rash.  Neurological:  Negative for dizziness.  Hematological:  Does not bruise/bleed easily.   Physical Exam Updated Vital Signs BP 114/81 (BP Location: Right Arm)   Pulse 76   Temp 97.9 F (36.6 C) (Oral)   Resp 15   SpO2 99%   Physical Exam Vitals and nursing note reviewed.  Constitutional:      General: He is not in acute distress.    Appearance: He is not ill-appearing.  HENT:     Head: Normocephalic and atraumatic.     Nose: No congestion.     Mouth/Throat:     Comments: Oropharynx is visualized tongue uvula are both midline, controlling oral secretions. Eyes:     Extraocular Movements: Extraocular movements intact.     Conjunctiva/sclera: Conjunctivae normal.     Pupils: Pupils are equal, round, and reactive to light.     Comments: Patient has slight edema around the periorbital region of the right eye, there is no noted erythema, no lacerations or abrasions present, no sclera injection, no drainage or discharge from the eye, no proptosis, EOMs and PERRLA, no dendritic lesion,  no blood noted the anterior trim the eye.  Please see picture for full detail.  Cardiovascular:     Rate and Rhythm: Normal rate and regular rhythm.     Pulses: Normal pulses.     Heart sounds: No murmur heard.   No friction rub. No gallop.  Pulmonary:     Effort: No respiratory distress.     Breath sounds: No wheezing, rhonchi or rales.  Skin:    General: Skin is warm and dry.     Comments: Limited skin exam was performed no noted rash on patient's chest, abdomen, upper and or  lower extremities.  Neurological:     Mental Status: He is alert.  Psychiatric:        Mood and Affect: Mood normal.     ED Results / Procedures / Treatments   Labs (all labs ordered are listed, but only abnormal results are displayed) Labs Reviewed - No data to display  EKG None  Radiology No results found.  Procedures Procedures   Medications Ordered in ED Medications  tetracaine (PONTOCAINE) 0.5 % ophthalmic solution 2 drop (has no administration in time range)  fluorescein ophthalmic strip 1 strip (has no administration in time range)    ED Course  I have reviewed the triage vital signs and the nursing notes.  Pertinent labs & imaging results that were available during my care of the patient were reviewed by me and considered in my medical decision making (see chart for details).    MDM Rules/Calculators/A&P                          Initial impression-patient presents with right eye swelling.  He is alert, does not appear acute stress, vital signs reassuring.  Work-up-visual acuity intact bilaterally 20/20 vision     Rule out-I have low suspicion for orbital cellulitis or periseptal cellulitis as patient had no pain with EOMs, there is no induration, fluctuance noted on exam around eyes.  Low suspicion for keratitis as patient does not wear contacts, eyes were visualized there is no visible dendritic lesion noted, there is no severe amount of purulent discharge noted.  Low suspicion for  corneal abrasion as there is no pain with eye movements, no abnormalities present during my exam.  Low suspicion for hyphema as patient denies recent trauma to the area no blood noted in the anterior chamber.  Low suspicion for bacterial or viral conjunctivitis there is no apparent discharge present my exam presentation atypical etiology.  Low suspicion for anaphylactic shock vital signs reassuring, no GI symptoms, no systemic rash present.  Plan-   Right eye swelling-unclear etiology likely a reaction from possible bug bite, will recommend H1 and H2 blockers, will also provide him with antibiotics will have him withhold this unless symptoms not improved after 2 days time.  Follow-up with PCP as needed.   Vital signs have remained stable, no indication for hospital admission.  Patient discussed with attending and they agreed with assessment and plan.  Patient given at home care as well strict return precautions.  Patient verbalized that they understood agreed to said plan..  Final Clinical Impression(s) / ED Diagnoses Final diagnoses:  Periorbital swelling    Rx / DC Orders ED Discharge Orders          Ordered    loratadine (CLARITIN) 10 MG tablet  Daily        03/09/21 1332    famotidine (PEPCID) 20 MG tablet  Daily        03/09/21 1332    doxycycline (VIBRAMYCIN) 100 MG capsule  2 times daily        03/09/21 1332             Barnie Del 03/09/21 1334    Pricilla Loveless, MD 03/13/21 [phone]

## 2021-03-09 NOTE — Discharge Instructions (Addendum)
Likely have a reaction from a bug bite.  I have started you on Claritin as well as Pepcid please take this as prescribed.  I have also given you prescription for an antibiotic doxycycline please only use if symptoms not improved after 2 days time.  If you use the doxycycline you must follow-up with your primary care provider in a week's time for reevaluation.  Come back to the emergency department if you develop chest pain, shortness of breath, severe abdominal pain, uncontrolled nausea, vomiting, diarrhea.

## 2021-03-09 NOTE — ED Triage Notes (Signed)
Pt reports right eye pain and swelling that started yesterday. Denies injury.

## 2021-03-09 NOTE — ED Notes (Signed)
Pt. Refused to let me get vitals and refused to listen to discharge instructions. Pt. At nurses station requesting to leave.

## 2021-03-09 NOTE — ED Notes (Signed)
Visual acuity  Right eye 20/20  Left eye 20/20  Both eyes 20/20

## 2021-06-16 IMAGING — CT CT ANGIO CHEST-ABD-PELV FOR DISSECTION W/ AND WO/W CM
2 of 7 series · 14 of 46 positions shown, 16 images · IV contrast (omnipaque)
Comparison: 12/28/2018

CLINICAL DATA: Chest pain. Acute aortic syndrome suspected.

EXAM:
CT ANGIOGRAPHY CHEST, ABDOMEN AND PELVIS
TECHNIQUE: Multidetector CT imaging through the chest, abdomen and pelvis was
performed using the standard protocol during bolus administration of
intravenous contrast. Multiplanar reconstructed images and MIPs were
obtained and reviewed to evaluate the vascular anatomy.
CONTRAST:  100mL OMNIPAQUE IOHEXOL 350 MG/ML SOLN

[Series 5: axial arterial · axial · arterial · 0.75mm/px · z∈[+567,+1176]mm · 11 of 235 slices shown, 13 images]
[im 16/235  soft-tissue]
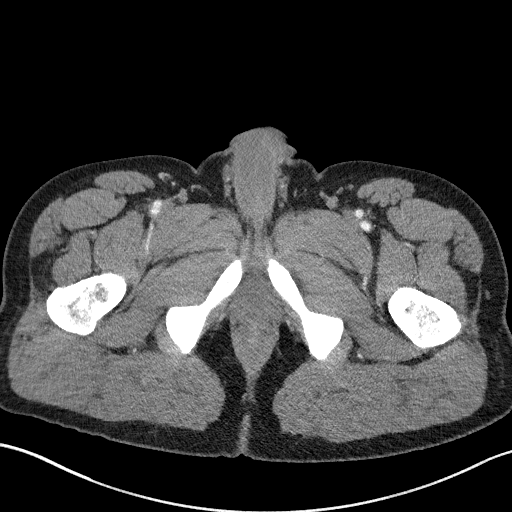
[im 16/235  bone]
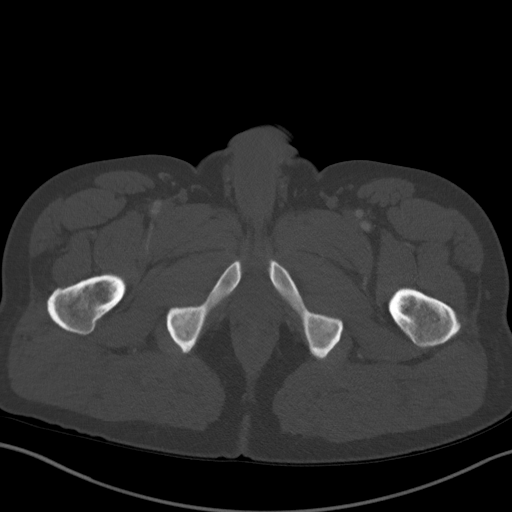
[im 32/235  soft-tissue]
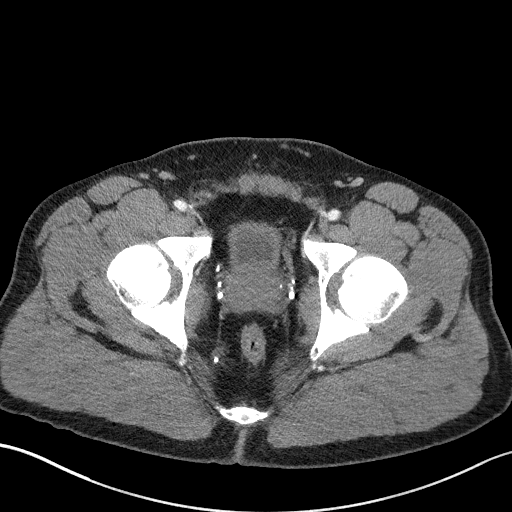
[im 63/235  soft-tissue]
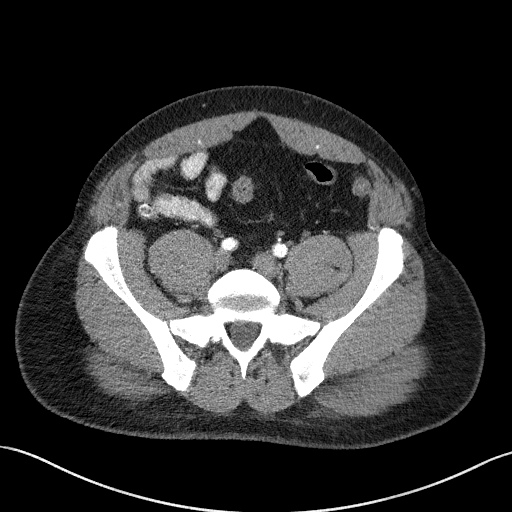
[im 79/235  soft-tissue]
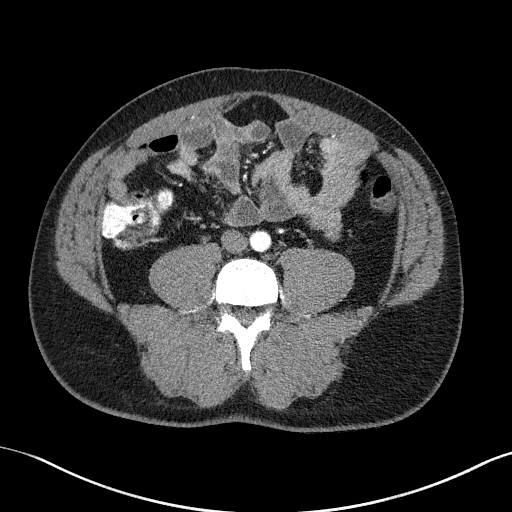
[im 94/235  soft-tissue]
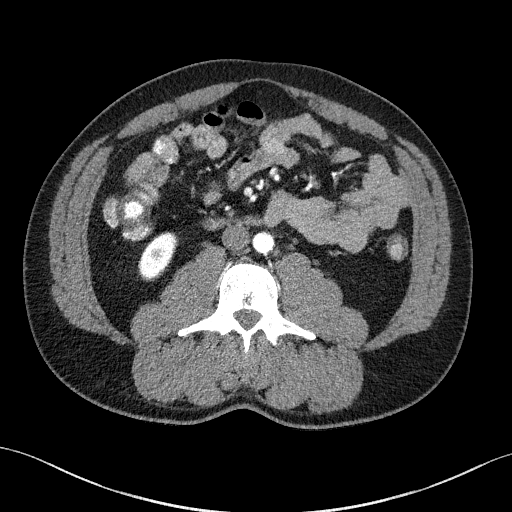
[im 125/235  soft-tissue]
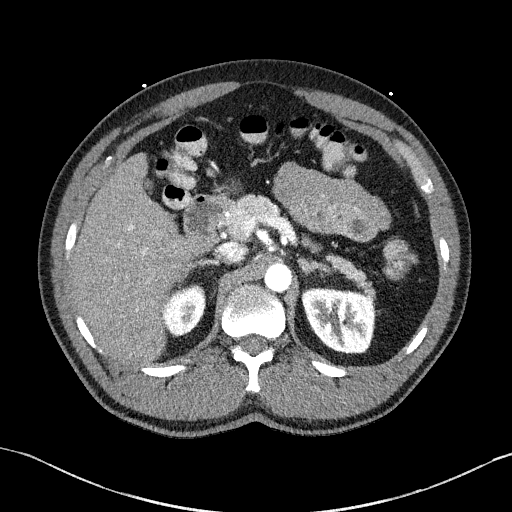
[im 141/235  soft-tissue]
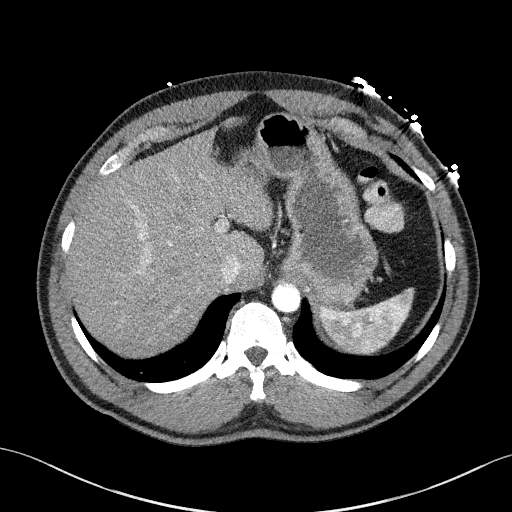
[im 157/235  soft-tissue]
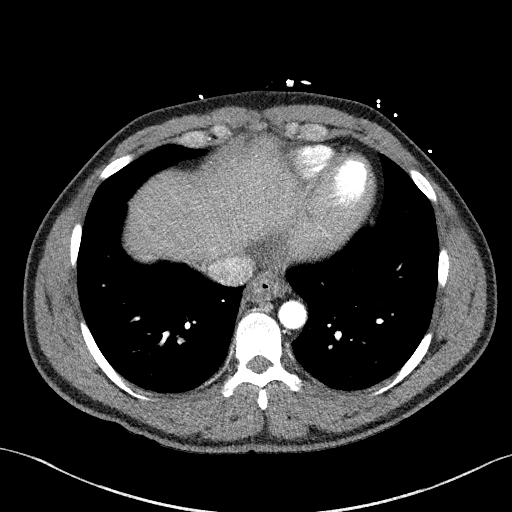
[im 172/235  soft-tissue]
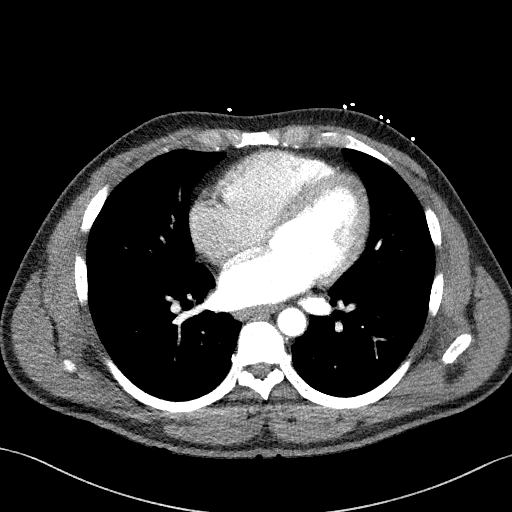
[im 172/235  bone]
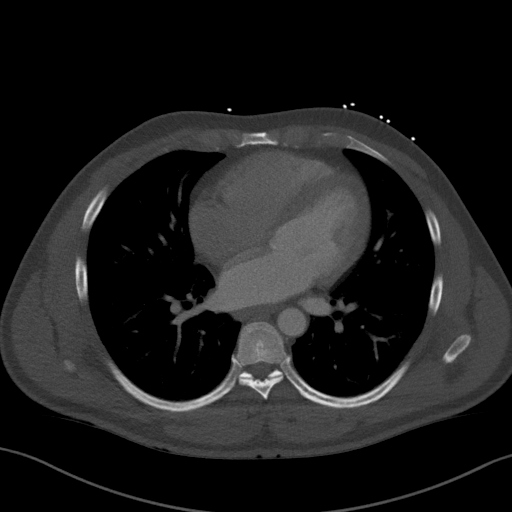
[im 203/235  soft-tissue]
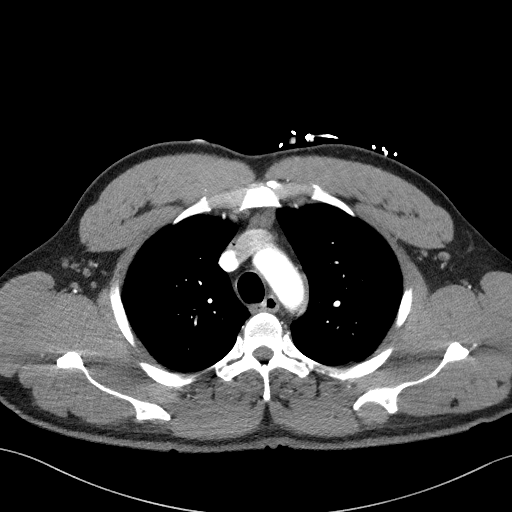
[im 219/235  soft-tissue]
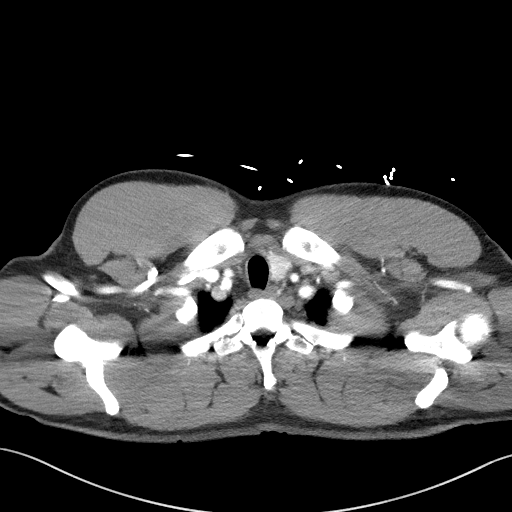

[Series 9: cor soft · coronal · 0.97mm/px · 3 of 166 slices shown]
[im 42/166  soft-tissue]
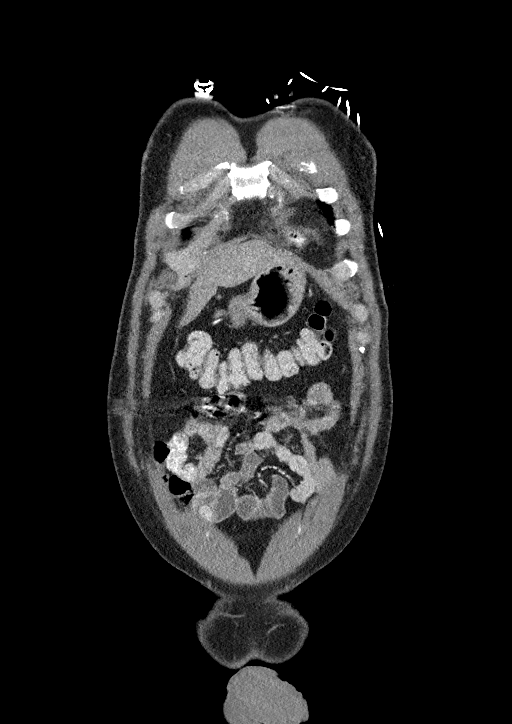
[im 83/166  soft-tissue]
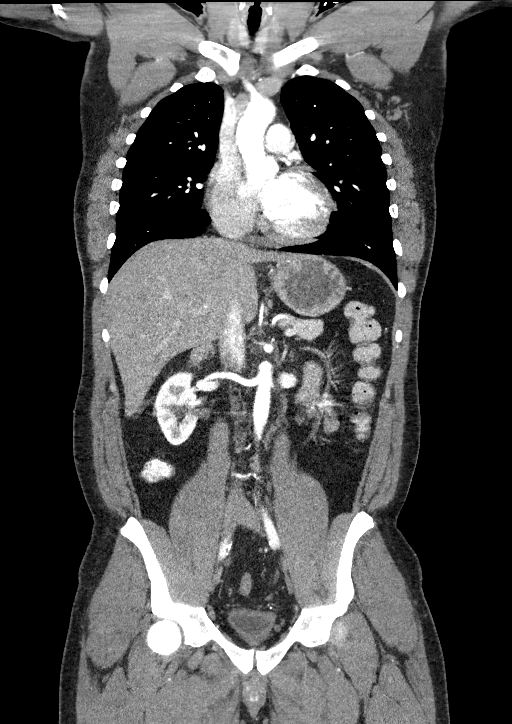
[im 124/166  soft-tissue]
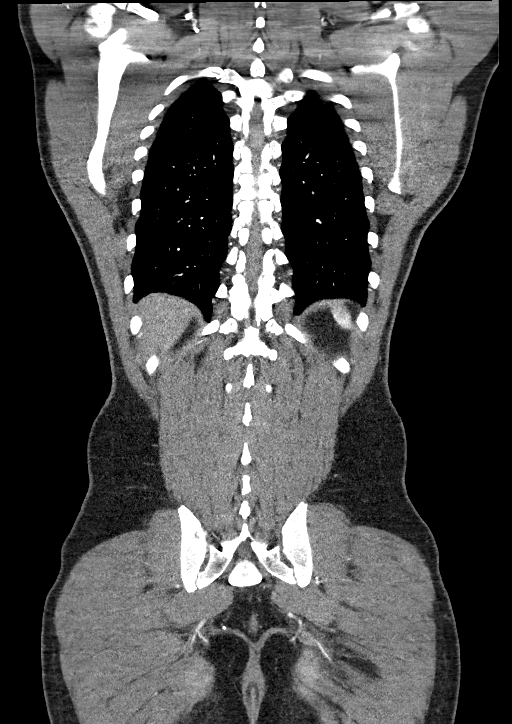

[14 of 46 positions shown; findings below may reference images not displayed]

FINDINGS: CTA CHEST FINDINGS

Cardiovascular: Preferential opacification of the thoracic aorta. No
evidence of thoracic aortic aneurysm or dissection. Normal heart
size. No pericardial effusion.

Mediastinum/Nodes: No enlarged mediastinal, hilar, or axillary lymph
nodes. Similar appearance chronic mild circumferential wall
thickening of the distal esophagus. Normal appearance of the thyroid
gland. The trachea appears patent and is midline.

Lungs/Pleura: Lungs are clear. No pleural effusion or pneumothorax.

Musculoskeletal: No chest wall abnormality. No acute or significant
osseous findings.

Review of the MIP images confirms the above findings.

CTA ABDOMEN AND PELVIS FINDINGS

VASCULAR

Aorta: Normal caliber aorta without aneurysm, dissection, vasculitis
or significant stenosis.

Celiac: Patent without evidence of aneurysm, dissection, vasculitis
or significant stenosis.

SMA: Patent without evidence of aneurysm, dissection, vasculitis or
significant stenosis.

Renals: Both renal arteries are patent without evidence of aneurysm,
dissection, vasculitis, fibromuscular dysplasia or significant
stenosis.

IMA: Patent without evidence of aneurysm, dissection, vasculitis or
significant stenosis.

Inflow: Patent without evidence of aneurysm, dissection, vasculitis
or significant stenosis.

Veins: No obvious venous abnormality within the limitations of this
arterial phase study.

Review of the MIP images confirms the above findings.

NON-VASCULAR

Hepatobiliary: Again seen are small low density foci within the
inferior right lobe of liver. These measure less than 1 cm and are
too small to reliably characterize. The gallbladder is unremarkable.

Pancreas: Unremarkable. No pancreatic ductal dilatation or
surrounding inflammatory changes.

Spleen: Normal in size without focal abnormality.

Adrenals/Urinary Tract: Adrenal glands are unremarkable. Kidneys are
normal, without renal calculi, focal lesion, or hydronephrosis.
Bladder is unremarkable.

Stomach/Bowel: Stomach is within normal limits. Appendix appears
normal. No evidence of bowel wall thickening, distention, or
inflammatory changes.

Lymphatic: No significant vascular findings are present. No enlarged
abdominal or pelvic lymph nodes.

Reproductive: Prostate is unremarkable.

Other: No abdominal wall hernia or abnormality. No abdominopelvic
ascites.

Musculoskeletal: No acute or significant osseous findings.

Review of the MIP images confirms the above findings.
IMPRESSION: 1. No evidence for acute aortic syndrome.
2. No acute findings within the chest, abdomen or pelvis.
3. Similar appearance of circumferential wall thickening of the
distal esophagus which may be the sequelae of chronic esophagitis.

Aortic Atherosclerosis ([id number]).

## 2021-06-16 IMAGING — DX DG ABDOMEN ACUTE W/ 1V CHEST
4 series · 5 of 5 positions shown · non-contrast
Comparison: None.

CLINICAL DATA: Epigastric pain and vomiting.

EXAM:
DG ABDOMEN ACUTE W/ 1V CHEST

[chest pa]
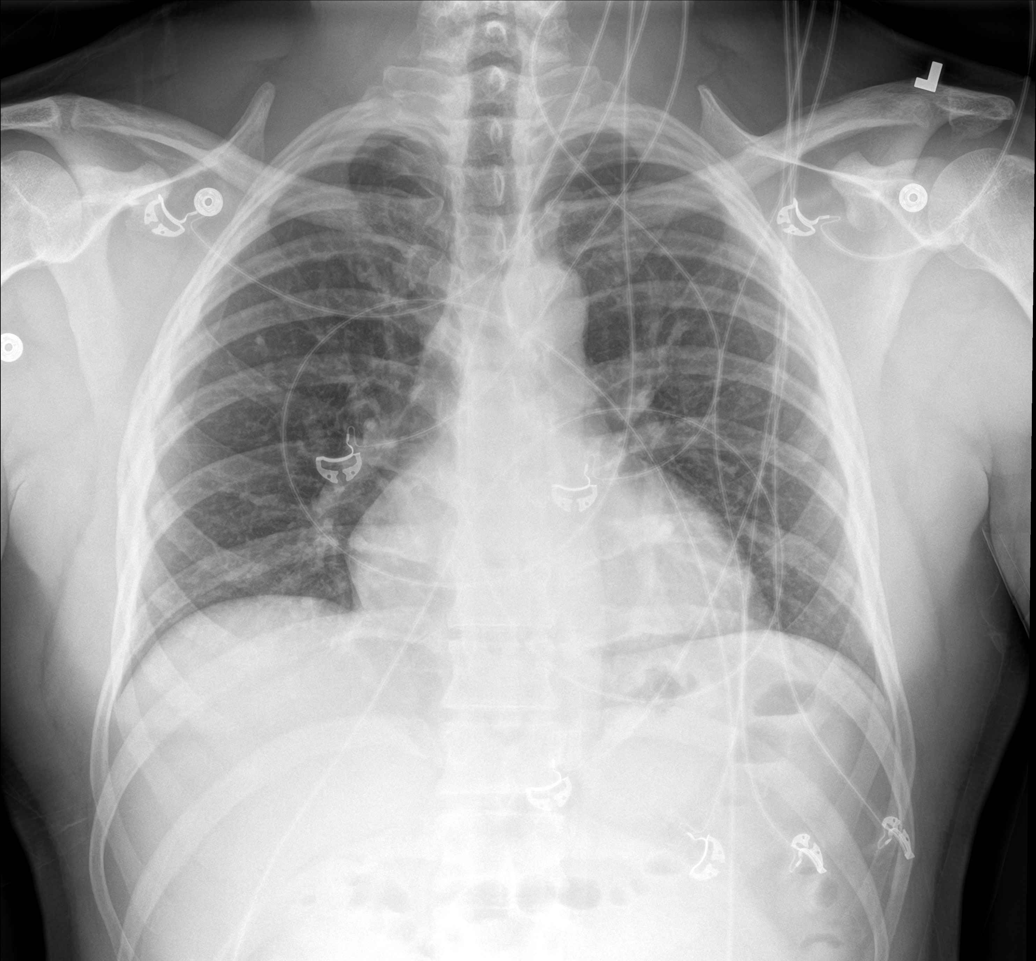

[Series 2: abdomen erect · 0.14mm/px · 2 of 2 slices shown]
[im 1/2]
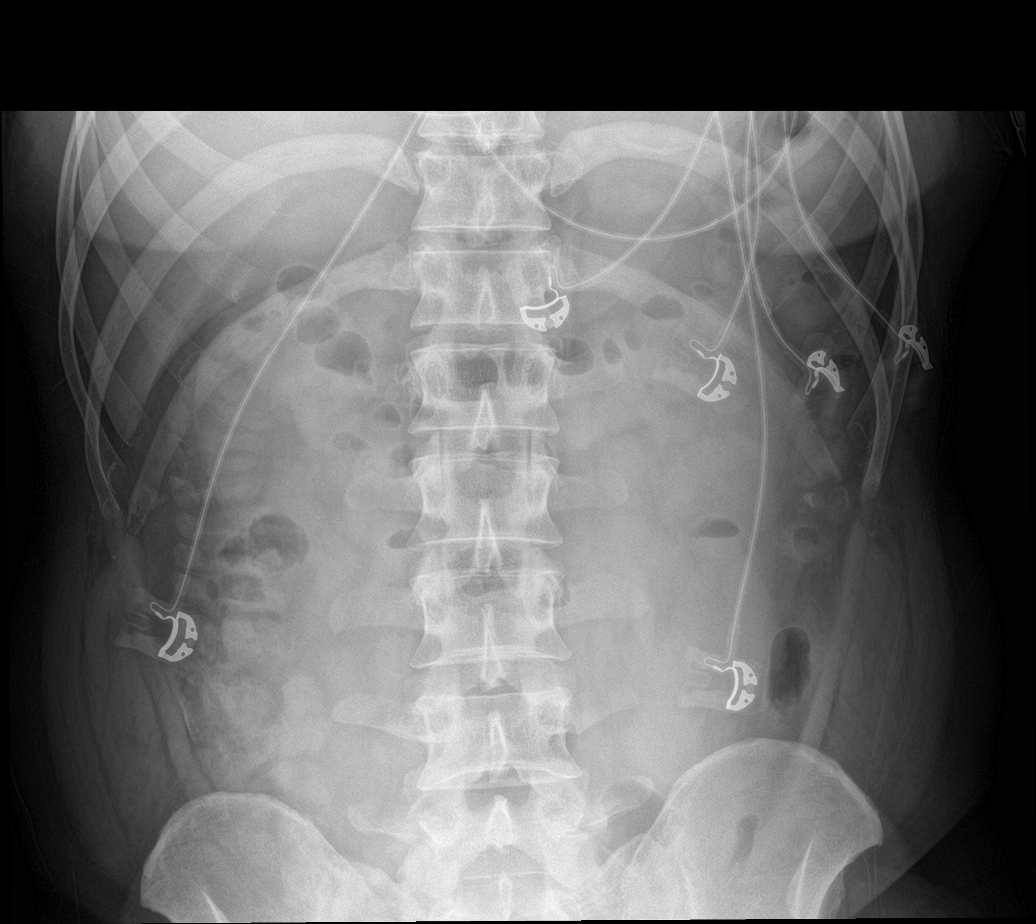
[im 2/2]
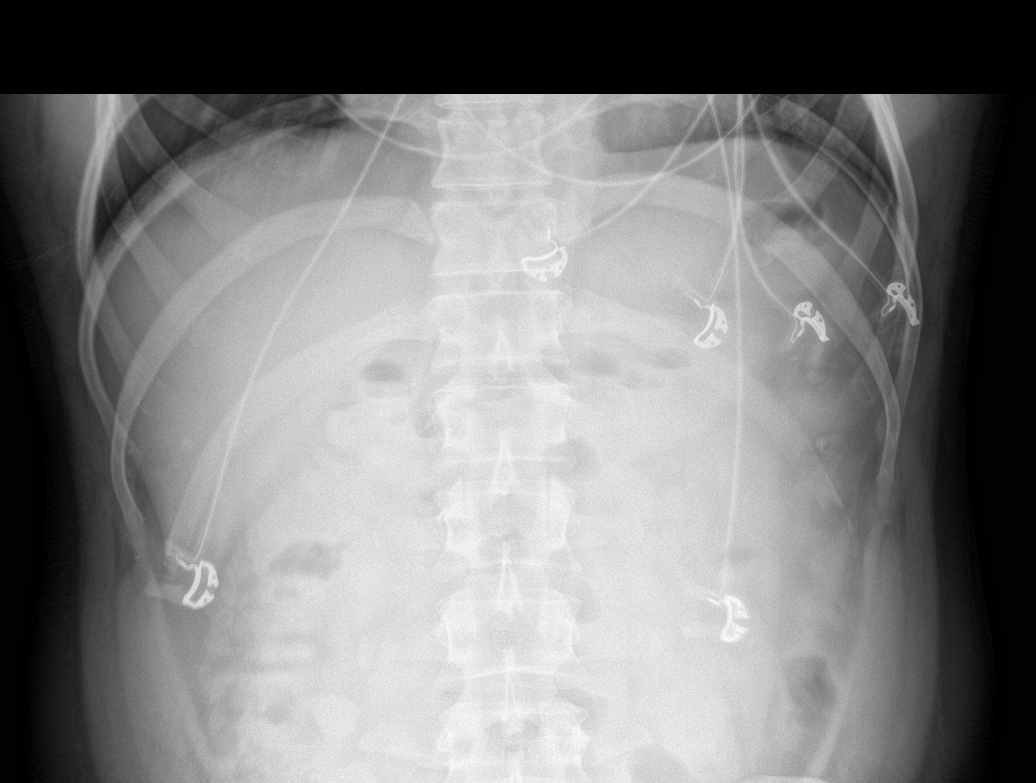

[abdomen supine (1 of 2)]
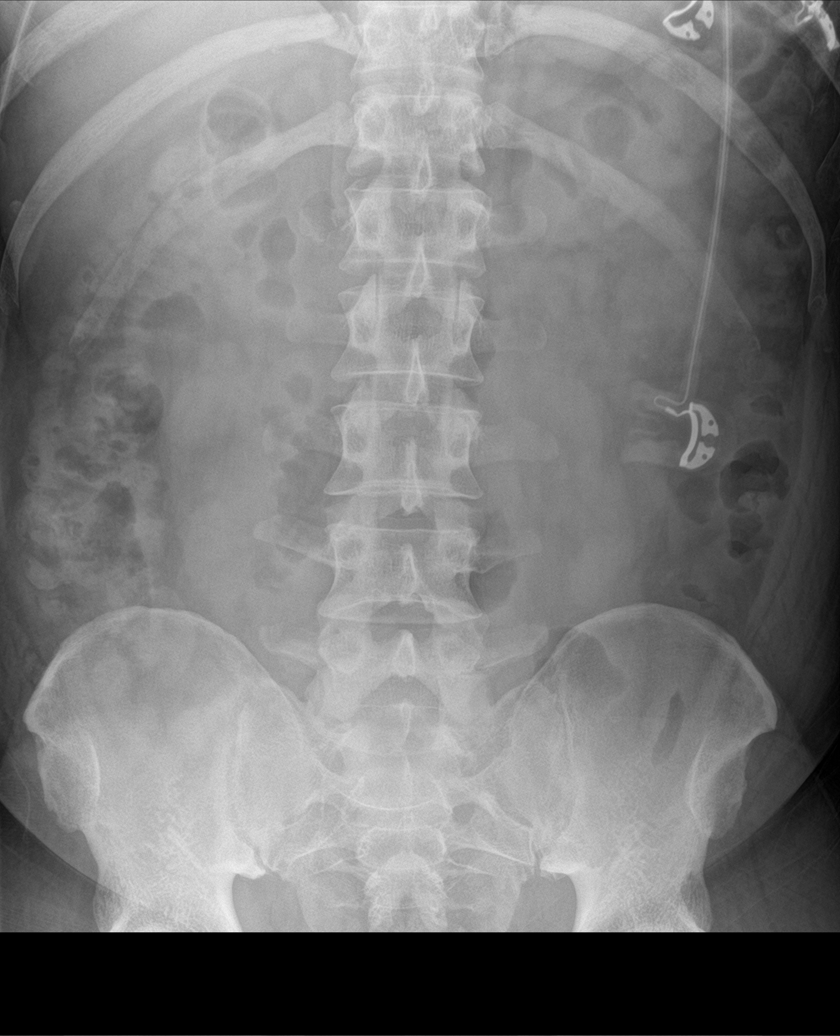

[abdomen supine (2 of 2)]
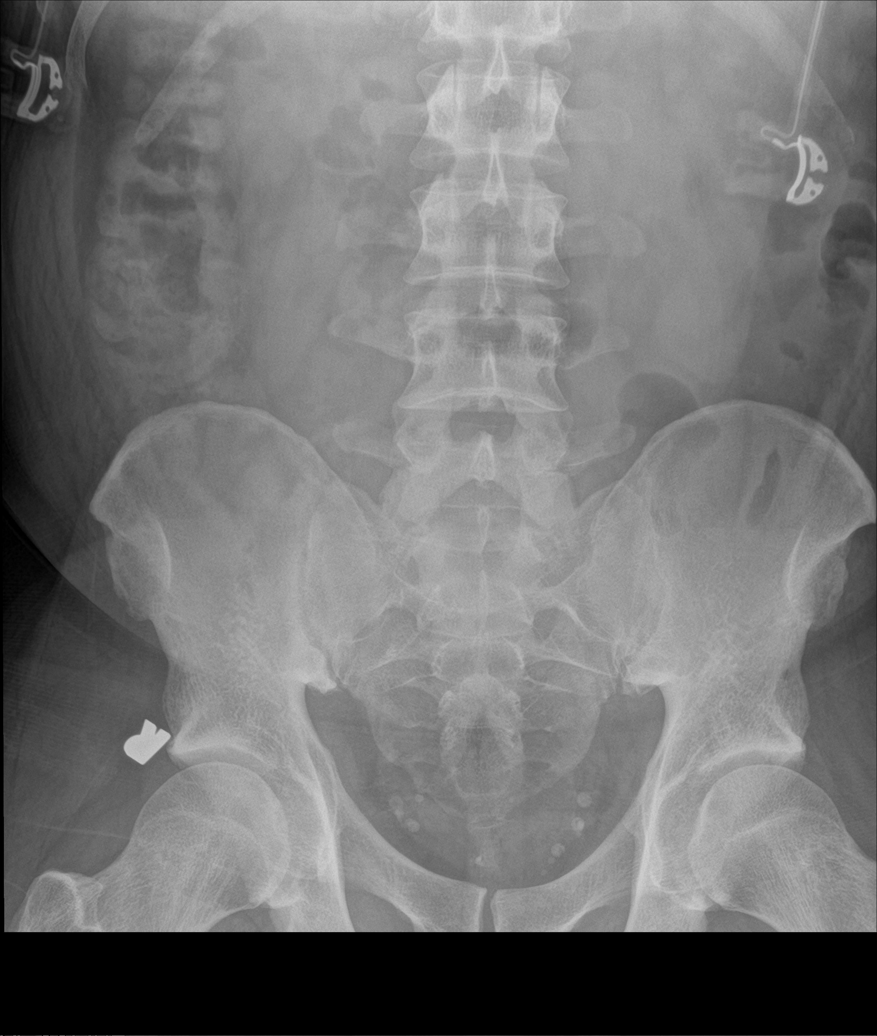

[5 of 5 positions shown; findings below may reference images not displayed]

FINDINGS: There is no evidence of dilated bowel loops or free intraperitoneal
air. No radiopaque calculi or other significant radiographic
abnormality is seen. Heart size and mediastinal contours are within
normal limits. Both lungs are clear.
IMPRESSION: Negative abdominal radiographs.  No acute cardiopulmonary disease.

## 2022-01-22 ENCOUNTER — Ambulatory Visit: Payer: Self-pay | Admitting: Interventional Cardiology

## 2022-02-01 ENCOUNTER — Encounter: Payer: Self-pay | Admitting: *Deleted
